# Patient Record
Sex: Male | Born: 1980 | Race: White | Hispanic: No | Marital: Married | Smoking: Former smoker
Health system: Southern US, Community
[De-identification: ages and names within clinical notes are randomized; demographics above are authoritative.]

## PROBLEM LIST (undated history)

## (undated) DIAGNOSIS — Z87891 Personal history of nicotine dependence: Secondary | ICD-10-CM

## (undated) DIAGNOSIS — E669 Obesity, unspecified: Secondary | ICD-10-CM

## (undated) DIAGNOSIS — I10 Essential (primary) hypertension: Secondary | ICD-10-CM

## (undated) DIAGNOSIS — R740 Nonspecific elevation of levels of transaminase and lactic acid dehydrogenase [LDH]: Secondary | ICD-10-CM

## (undated) HISTORY — DX: Essential (primary) hypertension: I10

## (undated) HISTORY — DX: Personal history of nicotine dependence: Z87.891

## (undated) HISTORY — DX: Nonspecific elevation of levels of transaminase and lactic acid dehydrogenase (ldh): R74.0

## (undated) HISTORY — DX: Obesity, unspecified: E66.9

## (undated) HISTORY — PX: KNEE SURGERY: SHX244

---

## 2017-12-16 ENCOUNTER — Ambulatory Visit (INDEPENDENT_AMBULATORY_CARE_PROVIDER_SITE_OTHER): Payer: No Typology Code available for payment source | Admitting: Physician Assistant

## 2017-12-16 ENCOUNTER — Encounter (INDEPENDENT_AMBULATORY_CARE_PROVIDER_SITE_OTHER): Payer: Self-pay

## 2017-12-16 ENCOUNTER — Encounter: Payer: Self-pay | Admitting: Physician Assistant

## 2017-12-16 VITALS — BP 142/92 | HR 82 | Ht 73.0 in | Wt 258.0 lb

## 2017-12-16 DIAGNOSIS — Z87891 Personal history of nicotine dependence: Secondary | ICD-10-CM | POA: Diagnosis not present

## 2017-12-16 DIAGNOSIS — R202 Paresthesia of skin: Secondary | ICD-10-CM | POA: Diagnosis not present

## 2017-12-16 DIAGNOSIS — Z0189 Encounter for other specified special examinations: Secondary | ICD-10-CM

## 2017-12-16 DIAGNOSIS — E6609 Other obesity due to excess calories: Secondary | ICD-10-CM | POA: Diagnosis not present

## 2017-12-16 DIAGNOSIS — Z131 Encounter for screening for diabetes mellitus: Secondary | ICD-10-CM | POA: Diagnosis not present

## 2017-12-16 DIAGNOSIS — Z7689 Persons encountering health services in other specified circumstances: Secondary | ICD-10-CM

## 2017-12-16 DIAGNOSIS — Z130 Encounter for screening for diseases of the blood and blood-forming organs and certain disorders involving the immune mechanism: Secondary | ICD-10-CM

## 2017-12-16 DIAGNOSIS — Z13220 Encounter for screening for lipoid disorders: Secondary | ICD-10-CM | POA: Diagnosis not present

## 2017-12-16 DIAGNOSIS — Z832 Family history of diseases of the blood and blood-forming organs and certain disorders involving the immune mechanism: Secondary | ICD-10-CM

## 2017-12-16 DIAGNOSIS — I10 Essential (primary) hypertension: Secondary | ICD-10-CM | POA: Diagnosis not present

## 2017-12-16 DIAGNOSIS — Z008 Encounter for other general examination: Secondary | ICD-10-CM

## 2017-12-16 NOTE — Progress Notes (Signed)
HPI:                                                                William BottcherBrandon Foley is a 37 y.o. male [facility] today to establish care  Current concerns: high blood pressure  HTN: has been told at urgent care visits for the last year that his BP is high. Does not monitor BP's at home. Family hx of HTN in his father. Denies vision change, headache, chest pain with exertion, orthopnea, lightheadedness, syncope and edema. Risk factors include: obesity, male sex    [facility]/16/2017  Decreased Interest 0  Down, Depressed, Hopeless 0  PHQ - 2 Score 0    No flowsheet data found.    Past Medical History:  Diagnosis Date  . Former smoker   . Hypertension    Past Surgical History:  Procedure Laterality Date  . KNEE SURGERY Right    Social History   Tobacco Use  . Smoking status: Former Smoker    Last attempt to quit: 12/16/2016    Years since quitting: 1.0  . Smokeless tobacco: Never Used  Substance Use Topics  . Alcohol use: Yes    Alcohol/week: 1.8 oz    Types: 3 Standard drinks or equivalent per week   family history includes Bone cancer in his paternal grandmother; Hemophilia in his brother; Hyperlipidemia in his father; Hypertension in his father; Lung cancer in his maternal grandfather; Von Willebrand disease in his maternal aunt and mother.    ROS: Review of Systems  HENT: Positive for congestion and nosebleeds.   Neurological: Positive for tingling (bilateral fingertips/toes).  Endo/Heme/Allergies: Positive for environmental allergies.  All other systems reviewed and are negative.    Medications: No current outpatient medications on file.   No current facility-administered medications for this visit.    No Known Allergies     Objective:  BP (!) 142/92   Pulse 82   Ht 6\' 1"  (1.854 m)   Wt 258 lb (117 kg)   BMI 34.04 kg/m  Gen:  alert, not ill-appearing, no  distress, appropriate for age, obese male HEENT: head normocephalic without obvious abnormality, conjunctiva and cornea clear, trachea midline Pulm: Normal work of breathing, normal phonation, clear to auscultation bilaterally, no wheezes, rales or rhonchi CV: Normal rate, regular rhythm, s1 and s2 distinct, no murmurs, clicks or rubs  Neuro: alert and oriented x 3, no tremor MSK: extremities atraumatic, normal gait and station, no peripheral edema Skin: intact, no rashes on exposed skin, no jaundice, no cyanosis Psych: well-groomed, cooperative, good eye contact, euthymic mood, affect mood-congruent, speech is articulate, and thought processes clear and goal-directed    No results found for this or any previous visit (from the past 72 hour(s)). No results found.    Assessment and Plan: 37 y.o. male with   1. Encounter to establish care - reviewed PMH, PSH, PFH, medications and allergies - reviewed health maintenance - declines influenza and Tdap - negative PHQ2  2. Hypertension goal BP (blood pressure) < 130/80 BP Readings from Last 3 Encounters:  12/16/17 (!) 142/92  - CVD risk factors include obesity and male sex - counseled on therapeutic lifestyle changes - patient to monitor BP at  home and log readings - 4 week follow-up   3. Paresthesias - chronic, intermittent symmetric distal upper and lower extremity paresthesias since childhood. Will start with labs and discuss nerve conduction study - Vitamin B12 - TSH + free T4  4. Class 1 obesity due to excess calories in adult, unspecified BMI, unspecified whether serious comorbidity present Wt Readings from Last 3 Encounters:  12/16/17 258 lb (117 kg)  - counseled on weight loss through decreased caloric intake and increased aerobic exercise   5. Former smoker   6. Encounter for biometric screening - Nicotine and Cotinine LC/MS/MS, U - CBC - Comprehensive metabolic panel - Lipid Panel w/reflex Direct LDL  7.  Screening for lipid disorders - Lipid Panel w/reflex Direct LDL  8. Screening for diabetes mellitus - Comprehensive metabolic panel  9. Screening for blood disease - CBC  Patient education and anticipatory guidance given Patient agrees with treatment plan Follow-up in 1 month for HTN or sooner as needed if symptoms worsen or fail to improve  [name] PA-C

## 2017-12-16 NOTE — Patient Instructions (Addendum)
Fasting labs: [facility] is located on the 1st floor and is a walk-in open M-F 7:30a-4:30p (closed 12:30-1:30p).  Nothing to eat or drink after midnight or at least 8 hours before your blood draw. You can have water and your medications.    For your blood pressure: - Goal <130/80 - monitor and log blood pressures at home - check around the same time each day in a relaxed setting - Limit salt to <2000 mg/day - Follow DASH eating plan - limit alcohol to 2 standard drinks per day for men and 1 per day for women - avoid tobacco products - weight loss: 7% of current body weight - follow-up every 6 months for your blood pressure    Physical Activity Recommendations for modifying lipids and lowering blood pressure Engage in aerobic physical activity to reduce LDL-cholesterol, non-HDL-cholesterol, and blood pressure  Frequency: 3-4 sessions per week  Intensity: moderate to vigorous  Duration: 40 minutes on average  Physical Activity Recommendations for secondary prevention 1. Aerobic exercise  Frequency: 3-5 sessions per week  Intensity: 50-80% capacity  Duration: 20 - 60 minutes  Examples: walking, treadmill, cycling, rowing, stair climbing, and arm/leg ergometry  2. Resistance exercise  Frequency: 2-3 sessions per week  Intensity: 10-15 repetitions/set to moderate fatigue  Duration: 1-3 sets of 8-10 upper and lower body exercises  Examples: calisthenics, elastic bands, cuff/hand weights, dumbbels, free weights, wall pulleys, and weight machines  Heart-Healthy Lifestyle  Eating a diet rich in vegetables, fruits and whole grains: also includes low-fat dairy products, poultry, fish, legumes, and nuts; limit intake of sweets, sugar-sweetened beverages and red meats  Getting regular exercise  Maintaining a healthy weight  Not smoking or getting help quitting  Staying on top of your health; for some people, lifestyle changes alone may not be enough to prevent a  heart attack or stroke. In these cases, taking a statin at the right dose will most likely be necessary

## 2017-12-21 LAB — CBC
HEMATOCRIT: 45.1 % (ref 38.5–50.0)
HEMOGLOBIN: 15.8 g/dL (ref 13.2–17.1)
MCH: 29.8 pg (ref 27.0–33.0)
MCHC: 35 g/dL (ref 32.0–36.0)
MCV: 85.1 fL (ref 80.0–100.0)
MPV: 10.2 fL (ref 7.5–12.5)
Platelets: 295 10*3/uL (ref 140–400)
RBC: 5.3 10*6/uL (ref 4.20–5.80)
RDW: 13 % (ref 11.0–15.0)
WBC: 8.3 10*3/uL (ref 3.8–10.8)

## 2017-12-21 LAB — LIPID PANEL W/REFLEX DIRECT LDL
Cholesterol: 198 mg/dL (ref <200)
HDL: 42 mg/dL (ref >40)
LDL CHOLESTEROL (CALC): 115 mg/dL — AB
NON-HDL CHOLESTEROL (CALC): 156 mg/dL — AB (ref <130)
Total CHOL/HDL Ratio: 4.7 (calc) (ref <5.0)
Triglycerides: 287 mg/dL — ABNORMAL HIGH (ref <150)

## 2017-12-21 LAB — COMPREHENSIVE METABOLIC PANEL
AG RATIO: 1.6 (calc) (ref 1.0–2.5)
ALBUMIN MSPROF: 4.6 g/dL (ref 3.6–5.1)
ALT: 31 U/L (ref 9–46)
AST: 22 U/L (ref 10–40)
Alkaline phosphatase (APISO): 50 U/L (ref 40–115)
BILIRUBIN TOTAL: 0.5 mg/dL (ref 0.2–1.2)
BUN: 16 mg/dL (ref 7–25)
CO2: 27 mmol/L (ref 20–32)
Calcium: 9.6 mg/dL (ref 8.6–10.3)
Chloride: 101 mmol/L (ref 98–110)
Creat: 1.23 mg/dL (ref 0.60–1.35)
Globulin: 2.8 g/dL (calc) (ref 1.9–3.7)
Glucose, Bld: 102 mg/dL — ABNORMAL HIGH (ref 65–99)
POTASSIUM: 4.5 mmol/L (ref 3.5–5.3)
SODIUM: 139 mmol/L (ref 135–146)
TOTAL PROTEIN: 7.4 g/dL (ref 6.1–8.1)

## 2017-12-21 LAB — TSH+FREE T4: TSH W/REFLEX TO FT4: 3.32 m[IU]/L (ref 0.40–4.50)

## 2017-12-21 LAB — NICOTINE AND COTININE LC/MS/MS, U: COTININE, URINE: <2 ng/mL

## 2017-12-21 LAB — VITAMIN B12: VITAMIN B 12: 729 pg/mL (ref 200–1100)

## 2017-12-22 ENCOUNTER — Encounter: Payer: Self-pay | Admitting: Physician Assistant

## 2017-12-22 DIAGNOSIS — R7301 Impaired fasting glucose: Secondary | ICD-10-CM | POA: Insufficient documentation

## 2017-12-22 DIAGNOSIS — E781 Pure hyperglyceridemia: Secondary | ICD-10-CM | POA: Insufficient documentation

## 2017-12-22 NOTE — Progress Notes (Signed)
Good morning William Foley,  Your labs look good overall - your triglycerides were a little high, but your total and LDL cholesterol is normal. Work on reducing saturated fats and eating lean proteins and foods rich in omega-3's. - fasting blood sugar is 102, which is a little high but not in a diabetic range. Recommend we recheck this in 6 months.  Your form and results will be faxed to your employer and you can pick-up a hard-copy from the front desk by end of day today.  Best, Vinetta Bergamoharley

## 2018-01-13 ENCOUNTER — Ambulatory Visit (INDEPENDENT_AMBULATORY_CARE_PROVIDER_SITE_OTHER): Payer: No Typology Code available for payment source | Admitting: Physician Assistant

## 2018-01-13 ENCOUNTER — Encounter: Payer: Self-pay | Admitting: Physician Assistant

## 2018-01-13 VITALS — BP 129/87 | HR 89 | Wt 250.0 lb

## 2018-01-13 DIAGNOSIS — I10 Essential (primary) hypertension: Secondary | ICD-10-CM | POA: Diagnosis not present

## 2018-01-13 DIAGNOSIS — E781 Pure hyperglyceridemia: Secondary | ICD-10-CM | POA: Diagnosis not present

## 2018-01-13 DIAGNOSIS — E6609 Other obesity due to excess calories: Secondary | ICD-10-CM | POA: Diagnosis not present

## 2018-01-13 DIAGNOSIS — R7301 Impaired fasting glucose: Secondary | ICD-10-CM

## 2018-01-13 NOTE — Progress Notes (Signed)
HPI:                                                                William BottcherBrandon Foley is a 37 y.o. male who [facility] today for hypertension follow-up  HTN: monitoring BP's at home. BP range 108-138/73-90, average diastolic above 80. Denies vision change, headache, chest pain with exertion, orthopnea, lightheadedness, syncope and edema. Risk factors include: male sex, obesity     [name] 2[facility] 12/16/2017  Decreased Interest 0  Down, Depressed, Hopeless 0  PHQ - 2 Score 0    No flowsheet data found.    Past Medical History:  Diagnosis Date  . Former smoker   . Hypertension    Past Surgical History:  Procedure Laterality Date  . KNEE SURGERY Right    Social History   Tobacco Use  . Smoking status: Former Smoker    Last attempt to quit: 12/16/2016    Years since quitting: 1.0  . Smokeless tobacco: Never Used  Substance Use Topics  . Alcohol use: Yes    Alcohol/week: 1.8 oz    Types: 3 Standard drinks or equivalent per week   family history includes Bone cancer in his paternal grandmother; Hemophilia in his brother; Hyperlipidemia in his father; Hypertension in his father; Lung cancer in his maternal grandfather; Von Willebrand disease in his maternal aunt and mother.    ROS: negative except as noted in the HPI  Medications: No current outpatient medications on file.   No current facility-administered medications for this visit.    No Known Allergies     Objective:  BP 129/87   Pulse 89   Wt 250 lb (113.4 kg)   BMI 32.98 kg/m  Gen:  alert, not ill-appearing, no distress, appropriate for age, athletic build HEENT: head normocephalic without obvious abnormality, conjunctiva and cornea clear, trachea midline Pulm: Normal work of breathing, normal phonation, clear to auscultation bilaterally, no wheezes, rales or rhonchi CV: Normal rate, regular rhythm, s1 and s2 distinct, no murmurs,  clicks or rubs  Neuro: alert and oriented x 3, no tremor MSK: extremities atraumatic, normal gait and station Skin: intact, no rashes on exposed skin, no jaundice, no cyanosis Psych: well-groomed, cooperative, good eye contact, euthymic mood, affect mood-congruent, speech is articulate, and thought processes clear and goal-directed    No results found for this or any previous visit (from the past 72 hour(s)). No results found.    Assessment and Plan: 37 y.o. male with   1. Hypertension goal BP (blood pressure) < 130/80 BP Readings from Last 3 Encounters:  01/13/18 129/87  12/16/17 (!) 142/92  - home readings show diastolic hypertension. CVD risk factors - male sex and obesity. Recommend he continue therapeutic lifestyle changes and monitor BP intermittently at home. I think he can manage his BP with diet and exercise. Will consider adding medication if diastolic BP is consistently >90 or he develops new risk factors   2. Fasting hyperglycemia No results found for: GLUF   3. Hypertriglyceridemia Lab Results  Component Value Date   CHOL 198 12/19/2017   HDL 42 12/19/2017   LDLCALC 115 (H) 12/19/2017   TRIG 287 (H) 12/19/2017   CHOLHDL 4.7 12/19/2017  - mildly elevated, therapeutic  lifestyle changes   4. Class 1 obesity due to excess calories with serious comorbidity in adult, unspecified BMI Wt Readings from Last 3 Encounters:  01/13/18 250 lb (113.4 kg)  12/16/17 258 lb (117 kg)  - has lost 8 pounds in 4 weeks! Following DASH eating plan and exercising. Encouraged to keep up regular aerobic exercise and decreased caloric intake   Patient education and anticipatory guidance given Patient agrees with treatment plan Follow-up in 6 months for hypertension or sooner as needed if symptoms worsen or fail to improve  [name] PA-C

## 2018-01-13 NOTE — Patient Instructions (Signed)

## 2018-07-17 ENCOUNTER — Ambulatory Visit: Payer: No Typology Code available for payment source | Admitting: Physician Assistant

## 2018-12-15 ENCOUNTER — Ambulatory Visit (INDEPENDENT_AMBULATORY_CARE_PROVIDER_SITE_OTHER): Payer: No Typology Code available for payment source | Admitting: Physician Assistant

## 2018-12-15 ENCOUNTER — Encounter: Payer: Self-pay | Admitting: Physician Assistant

## 2018-12-15 ENCOUNTER — Other Ambulatory Visit: Payer: Self-pay | Admitting: Physician Assistant

## 2018-12-15 VITALS — BP 132/83 | HR 80 | Wt 263.0 lb

## 2018-12-15 DIAGNOSIS — R7401 Elevation of levels of liver transaminase levels: Secondary | ICD-10-CM

## 2018-12-15 DIAGNOSIS — E6609 Other obesity due to excess calories: Secondary | ICD-10-CM | POA: Diagnosis not present

## 2018-12-15 DIAGNOSIS — E781 Pure hyperglyceridemia: Secondary | ICD-10-CM

## 2018-12-15 DIAGNOSIS — R7301 Impaired fasting glucose: Secondary | ICD-10-CM

## 2018-12-15 DIAGNOSIS — Z0000 Encounter for general adult medical examination without abnormal findings: Secondary | ICD-10-CM

## 2018-12-15 DIAGNOSIS — I10 Essential (primary) hypertension: Secondary | ICD-10-CM | POA: Diagnosis not present

## 2018-12-15 DIAGNOSIS — R740 Nonspecific elevation of levels of transaminase and lactic acid dehydrogenase [LDH]: Secondary | ICD-10-CM

## 2018-12-15 NOTE — Patient Instructions (Addendum)
Send MyChart message with BP readings in 2 weeks Username: [id number]  For your blood pressure: - Goal <130/80 (Ideally 120's/70's) - monitor and log blood pressures at home - check around the same time each day in a relaxed setting - Limit salt to <2500 mg/day - Follow DASH (Dietary Approach to Stopping Hypertension) eating plan - Try to get at least 150 minutes of aerobic exercise per week - Aim to go on a brisk walk 30 minutes per day at least 5 days per week. If you're not active, gradually increase how long you walk by 5 minutes each week - limit alcohol: 2 standard drinks per day for men and 1 per day for women - avoid tobacco/nicotine products. Consider smoking cessation if you smoke - weight loss: 7% of current body weight can reduce your blood pressure by 5-10 points - follow-up at least every 6 months for your blood pressure. Follow-up sooner if your BP is not controlled   Preventive Care 18-39 Years, Male Preventive care refers to lifestyle choices and visits with your health care provider that can promote health and wellness. What does preventive care include?   A yearly physical exam. This is also called an annual well check.  Dental exams once or twice a year.  Routine eye exams. Ask your health care provider how often you should have your eyes checked.  Personal lifestyle choices, including: ? Daily care of your teeth and gums. ? Regular physical activity. ? Eating a healthy diet. ? Avoiding tobacco and drug use. ? Limiting alcohol use. ? Practicing safe sex. What happens during an annual well check? The services and screenings done by your health care provider during your annual well check will depend on your age, overall health, lifestyle risk factors, and family history of disease. Counseling Your health care provider may ask you questions about your:  Alcohol use.  Tobacco use.  Drug use.  Emotional well-being.  Home and relationship  well-being.  Sexual activity.  Eating habits.  Work and work Statistician. Screening You may have the following tests or measurements:  Height, weight, and BMI.  Blood pressure.  Lipid and cholesterol levels. These may be checked every 5 years starting at age 62.  Diabetes screening. This is done by checking your blood sugar (glucose) after you have not eaten for a while (fasting).  Skin check.  Hepatitis C blood test.  Hepatitis B blood test.  Sexually transmitted disease (STD) testing. Discuss your test results, treatment options, and if necessary, the need for more tests with your health care provider. Vaccines Your health care provider may recommend certain vaccines, such as:  Influenza vaccine. This is recommended every year.  Tetanus, diphtheria, and acellular pertussis (Tdap, Td) vaccine. You may need a Td booster every 10 years.  Varicella vaccine. You may need this if you have not been vaccinated.  HPV vaccine. If you are 36 or younger, you may need three doses over 6 months.  Measles, mumps, and rubella (MMR) vaccine. You may need at least one dose of MMR.You may also need a second dose.  Pneumococcal 13-valent conjugate (PCV13) vaccine. You may need this if you have certain conditions and have not been vaccinated.  Pneumococcal polysaccharide (PPSV23) vaccine. You may need one or two doses if you smoke cigarettes or if you have certain conditions.  Meningococcal vaccine. One dose is recommended if you are age 36-21 years and a first-year college student living in a residence hall, or if you have one of several  medical conditions. You may also need additional booster doses.  Hepatitis A vaccine. You may need this if you have certain conditions or if you travel or work in places where you may be exposed to hepatitis A.  Hepatitis B vaccine. You may need this if you have certain conditions or if you travel or work in places where you may be exposed to hepatitis  B.  Haemophilus influenzae type b (Hib) vaccine. You may need this if you have certain risk factors. Talk to your health care provider about which screenings and vaccines you need and how often you need them. This information is not intended to replace advice given to you by your health care provider. Make sure you discuss any questions you have with your health care provider. Document Released: 12/07/2001 Document Revised: 05/24/2017 Document Reviewed: 08/12/2015 Elsevier Interactive Patient Education  2019 [employer].

## 2018-12-15 NOTE — Progress Notes (Signed)
HPI:                                                                William Foley is a 38 y.o. male who presents [name] Primary [facility] today for annual physical exam  Current concerns: none    Depression screen [facility] 2/9 12/15/2018 12/16/2017  Decreased Interest 0 0  Down, Depressed, Hopeless 0 0  PHQ - 2 Score 0 0    No flowsheet data found.    Past Medical History:  Diagnosis Date  . Former smoker   . Hypertension   . Obesity   . Transaminitis 12/18/2018   Past Surgical History:  Procedure Laterality Date  . KNEE SURGERY Right    Social History   Tobacco Use  . Smoking status: Former Smoker    Packs/day: 0.50    Years: 8.00    Pack years: 4.00    Types: Cigarettes    Last attempt to quit: 12/16/2016    Years since quitting: 2.0  . Smokeless tobacco: Former Neurosurgeon    Types: Chew  Substance Use Topics  . Alcohol use: Yes    Alcohol/week: 3.0 standard drinks    Types: 3 Standard drinks or equivalent per week   family history includes Bone cancer in his paternal grandmother; Hemophilia in his brother; Hyperlipidemia in his father; Hypertension in his father; Lung cancer in his maternal grandfather; Von Willebrand disease in his maternal aunt and mother.    ROS: negative except as noted in the HPI  Medications: No current outpatient medications on file.   No current facility-administered medications for this visit.    No Known Allergies     Objective:  BP 132/83   Pulse 80   Wt 263 lb (119.3 kg)   BMI 34.70 kg/m  General Appearance:  Alert, cooperative, no distress, appropriate for age, obese male                            Head:  Normocephalic, without obvious abnormality                             Eyes:  PERRL, EOM's intact, conjunctiva and cornea clear, wearing glasses                             Ears:  TM pearly gray color and semitransparent, external ear canals normal, both ears                             Nose:  Nares symmetrical, mucosa pink                          Throat:  Lips, tongue, and mucosa are moist, pink, and intact; oropharynx clear, uvula midline; good dentition                             Neck:  Supple; symmetrical, trachea midline, no adenopathy; thyroid: no enlargement, symmetric, no tenderness/mass/nodules  Back:  Symmetrical, no curvature, ROM normal                                        Lungs:  Clear to auscultation bilaterally, respirations unlabored                             Heart:  normal rate & regular rhythm, S1 and S2 normal, no murmurs, rubs, or gallops                     Abdomen:  Soft, non-tender, no mass or organomegaly              Genitourinary:  deferred         Musculoskeletal:  Tone and strength strong and symmetrical, all extremities; no joint pain or edema, normal gait and station,                                     Lymphatic:  No adenopathy             Skin/Hair/Nails:  Limited skin examination of trunk, head, neck and upper extremities was performed and areas of sun damage are noted with scattered lentigines, brown flat macules, pigmented lesions with regular borders are noted                   Neurologic:  Alert and oriented x3, no cranial nerve deficits, sensation grossly intact, normal gait and station, no tremor Psych: well-groomed, cooperative, good eye contact, euthymic mood, affect mood-congruent, speech is articulate, and thought processes clear and goal-directed  BP Readings from Last 3 Encounters:  12/15/18 132/83  01/13/18 129/87  12/16/17 (!) 142/92     Results for orders placed or performed in visit on 12/15/18 (from the past 72 hour(s))  Hemoglobin A1c     Status: None   Collection Time: 12/15/18 10:12 AM  Result Value Ref Range   Hgb A1c MFr Bld 5.5 <5.7 % of total Hgb    Comment: For the purpose of screening for the presence of diabetes: . <5.7%       Consistent with the absence of  diabetes 5.7-6.4%    Consistent with increased risk for diabetes             (prediabetes) > or =6.5%  Consistent with diabetes . This assay result is consistent with a decreased risk of diabetes. . Currently, no consensus exists regarding use of hemoglobin A1c for diagnosis of diabetes in children. . According to American Diabetes Association (ADA) guidelines, hemoglobin A1c <7.0% represents optimal control in non-pregnant diabetic patients. Different metrics may apply to specific patient populations.  Standards of Medical Care in Diabetes(ADA). .    Mean Plasma Glucose 111 (calc)   eAG (mmol/L) 6.2 (calc)  Lipid Panel w/reflex Direct LDL     Status: Abnormal   Collection Time: 12/15/18 10:12 AM  Result Value Ref Range   Cholesterol 218 (H) <200 mg/dL   HDL 48 > OR = 40 mg/dL   Triglycerides 254 <982 mg/dL   LDL Cholesterol (Calc) 145 (H) mg/dL (calc)    Comment: Reference range: <100 . Desirable range <100 mg/dL for primary prevention;   <70 mg/dL for patients with CHD or diabetic patients  with > or =  2 CHD risk factors. Marland Kitchen. LDL-C is now calculated using the Martin-Hopkins  calculation, which is a validated novel method providing  better accuracy than the Friedewald equation in the  estimation of LDL-C.  Horald PollenMartin SS et al. Lenox AhrJAMA. 1610;960(452013;310(19): 2061-2068  (http://education.[facility].com/faq/FAQ164)    Total CHOL/HDL Ratio 4.5 <5.0 (calc)   Non-HDL Cholesterol (Calc) 170 (H) <130 mg/dL (calc)    Comment: For patients with diabetes plus 1 major ASCVD risk  factor, treating to a non-HDL-C goal of <100 mg/dL  (LDL-C of <40<70 mg/dL) is considered a therapeutic  option.   COMPLETE METABOLIC PANEL WITH GFR     Status: Abnormal   Collection Time: 12/15/18 10:12 AM  Result Value Ref Range   Glucose, Bld 97 65 - 99 mg/dL    Comment: .            Fasting reference interval .    BUN 15 7 - 25 mg/dL   Creat 9.811.14 1.910.60 - 4.781.35 mg/dL   GFR, Est Non African American 83 > OR =  60 mL/min/1.5373m2   GFR, Est African American 96 > OR = 60 mL/min/1.5473m2   BUN/Creatinine Ratio NOT APPLICABLE 6 - 22 (calc)   Sodium 139 135 - 146 mmol/L   Potassium 4.4 3.5 - 5.3 mmol/L   Chloride 102 98 - 110 mmol/L   CO2 29 20 - 32 mmol/L   Calcium 9.6 8.6 - 10.3 mg/dL   Total Protein 7.2 6.1 - 8.1 g/dL   Albumin 4.6 3.6 - 5.1 g/dL   Globulin 2.6 1.9 - 3.7 g/dL (calc)   AG Ratio 1.8 1.0 - 2.5 (calc)   Total Bilirubin 0.5 0.2 - 1.2 mg/dL   Alkaline phosphatase (APISO) 44 36 - 130 U/L   AST 56 (H) 10 - 40 U/L   ALT 73 (H) 9 - 46 U/L  CBC     Status: None   Collection Time: 12/15/18 10:12 AM  Result Value Ref Range   WBC 6.4 3.8 - 10.8 Thousand/uL   RBC 4.80 4.20 - 5.80 Million/uL   Hemoglobin 14.6 13.2 - 17.1 g/dL   HCT 29.541.1 62.138.5 - 30.850.0 %   MCV 85.6 80.0 - 100.0 fL   MCH 30.4 27.0 - 33.0 pg   MCHC 35.5 32.0 - 36.0 g/dL   RDW 65.712.6 84.611.0 - 96.215.0 %   Platelets 283 140 - 400 Thousand/uL   MPV 10.2 7.5 - 12.5 fL   No results found.    Assessment and Plan: 38 y.o. male with   .Apolinar JunesBrandon was seen today for annual exam.  Diagnoses and all orders for this visit:  Encounter for annual physical exam  Class 1 obesity due to excess calories in adult, unspecified BMI, unspecified whether serious comorbidity present  Hypertension goal BP (blood pressure) < 130/80  Hypertriglyceridemia  Fasting hyperglycemia   - Personally reviewed PMH, PSH, PFH, medications, allergies, HM - Age-appropriate cancer screening: n/a - Influenza declined - Tdap declined - PHQ2 negative - Routine fasting labs pending - BMI 34 - patient counseled on weight loss through decreased caloric intake and increased aerobic exercise - BP in stage 1 hypertensive range - patient to monitor and log BP's at home and send readings via MyChart in 2 weeks. Counseled on therapeutic lifestyle changes     Patient education and anticipatory guidance given Patient agrees with treatment plan Follow-up based on lab  results and home BP readings or sooner as needed if symptoms worsen or fail to improve  Levonne Hubertharley E. Ladawn Boullion  PA-C

## 2018-12-16 LAB — COMPLETE METABOLIC PANEL WITH GFR
AG Ratio: 1.8 (calc) (ref 1.0–2.5)
ALT: 73 U/L — ABNORMAL HIGH (ref 9–46)
AST: 56 U/L — ABNORMAL HIGH (ref 10–40)
Albumin: 4.6 g/dL (ref 3.6–5.1)
Alkaline phosphatase (APISO): 44 U/L (ref 36–130)
BUN: 15 mg/dL (ref 7–25)
CO2: 29 mmol/L (ref 20–32)
Calcium: 9.6 mg/dL (ref 8.6–10.3)
Chloride: 102 mmol/L (ref 98–110)
Creat: 1.14 mg/dL (ref 0.60–1.35)
GFR, Est African American: 96 mL/min/{1.73_m2} (ref >=60)
GFR, Est Non African American: 83 mL/min/{1.73_m2} (ref >=60)
Globulin: 2.6 g/dL (calc) (ref 1.9–3.7)
Glucose, Bld: 97 mg/dL (ref 65–99)
POTASSIUM: 4.4 mmol/L (ref 3.5–5.3)
Sodium: 139 mmol/L (ref 135–146)
Total Bilirubin: 0.5 mg/dL (ref 0.2–1.2)
Total Protein: 7.2 g/dL (ref 6.1–8.1)

## 2018-12-16 LAB — CBC
HCT: 41.1 % (ref 38.5–50.0)
Hemoglobin: 14.6 g/dL (ref 13.2–17.1)
MCH: 30.4 pg (ref 27.0–33.0)
MCHC: 35.5 g/dL (ref 32.0–36.0)
MCV: 85.6 fL (ref 80.0–100.0)
MPV: 10.2 fL (ref 7.5–12.5)
PLATELETS: 283 10*3/uL (ref 140–400)
RBC: 4.8 10*6/uL (ref 4.20–5.80)
RDW: 12.6 % (ref 11.0–15.0)
WBC: 6.4 10*3/uL (ref 3.8–10.8)

## 2018-12-16 LAB — LIPID PANEL W/REFLEX DIRECT LDL
Cholesterol: 218 mg/dL — ABNORMAL HIGH (ref <200)
HDL: 48 mg/dL (ref >=40)
LDL Cholesterol (Calc): 145 mg/dL (calc) — ABNORMAL HIGH
Non-HDL Cholesterol (Calc): 170 mg/dL (calc) — ABNORMAL HIGH (ref <130)
Total CHOL/HDL Ratio: 4.5 (calc) (ref <5.0)
Triglycerides: 129 mg/dL (ref <150)

## 2018-12-16 LAB — HEMOGLOBIN A1C
EAG (MMOL/L): 6.2 (calc)
Hgb A1c MFr Bld: 5.5 % of total Hgb (ref <5.7)
Mean Plasma Glucose: 111 (calc)

## 2018-12-18 ENCOUNTER — Encounter: Payer: Self-pay | Admitting: Physician Assistant

## 2018-12-18 ENCOUNTER — Other Ambulatory Visit: Payer: Self-pay | Admitting: Physician Assistant

## 2018-12-18 DIAGNOSIS — R740 Nonspecific elevation of levels of transaminase and lactic acid dehydrogenase [LDH]: Secondary | ICD-10-CM

## 2018-12-18 DIAGNOSIS — R7401 Elevation of levels of liver transaminase levels: Secondary | ICD-10-CM | POA: Insufficient documentation

## 2018-12-18 DIAGNOSIS — Z0189 Encounter for other specified special examinations: Secondary | ICD-10-CM

## 2018-12-18 HISTORY — DX: Elevation of levels of liver transaminase levels: R74.01

## 2018-12-18 NOTE — Addendum Note (Signed)
Addended by: Gena Fray E on: 12/18/2018 08:09 AM   Modules accepted: Orders

## 2019-02-05 DIAGNOSIS — Z3141 Encounter for fertility testing: Secondary | ICD-10-CM | POA: Diagnosis not present

## 2020-03-14 ENCOUNTER — Other Ambulatory Visit: Payer: Self-pay

## 2020-03-14 ENCOUNTER — Ambulatory Visit (INDEPENDENT_AMBULATORY_CARE_PROVIDER_SITE_OTHER): Payer: BC Managed Care – PPO

## 2020-03-14 ENCOUNTER — Ambulatory Visit (INDEPENDENT_AMBULATORY_CARE_PROVIDER_SITE_OTHER): Payer: BC Managed Care – PPO | Admitting: Sports Medicine

## 2020-03-14 ENCOUNTER — Encounter: Payer: Self-pay | Admitting: Sports Medicine

## 2020-03-14 DIAGNOSIS — M5136 Other intervertebral disc degeneration, lumbar region: Secondary | ICD-10-CM

## 2020-03-14 DIAGNOSIS — M51369 Other intervertebral disc degeneration, lumbar region without mention of lumbar back pain or lower extremity pain: Secondary | ICD-10-CM | POA: Insufficient documentation

## 2020-03-14 DIAGNOSIS — M545 Low back pain: Secondary | ICD-10-CM | POA: Diagnosis not present

## 2020-03-14 MED ORDER — PREDNISONE 50 MG PO TABS: ORAL_TABLET | ORAL | 0 refills | Status: DC

## 2020-03-14 NOTE — Progress Notes (Addendum)
    Procedures performed today:    None.  Independent interpretation of notes and tests performed by another provider:   None.  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 For some time now has had axial back pain, left-sided with radiation down the back of the left leg, but not to the knee. No red flag symptoms. We will start conservatively with prednisone, ibuprofen over-the-counter afterwards, home rehab exercises, he cannot do physical therapy due to his work. Getting some x-rays today, return to see me in 6 weeks, MRI for interventional planning if no better, we did discuss evolution anthropology of back pain and disc disease.    ___________________________________________ William Foley. Benjamin Stain, M.D., ABFM., CAQSM. Primary Care and Sports Medicine [place] [facility]  Adjunct Instructor of Family Medicine  [employer]

## 2020-03-14 NOTE — Assessment & Plan Note (Addendum)
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 For some time now has had axial back pain, left-sided with radiation down the back of the left leg, but not to the knee. No red flag symptoms. We will start conservatively with prednisone, ibuprofen over-the-counter afterwards, home rehab exercises, he cannot do physical therapy due to his work. Getting some x-rays today, return to see me in 6 weeks, MRI for interventional planning if no better, we did discuss evolution anthropology of back pain and disc disease.

## 2020-04-25 ENCOUNTER — Ambulatory Visit: Payer: BC Managed Care – PPO | Admitting: Sports Medicine

## 2020-08-25 ENCOUNTER — Ambulatory Visit (INDEPENDENT_AMBULATORY_CARE_PROVIDER_SITE_OTHER): Payer: BC Managed Care – PPO

## 2020-08-25 ENCOUNTER — Other Ambulatory Visit: Payer: Self-pay

## 2020-08-25 ENCOUNTER — Ambulatory Visit (INDEPENDENT_AMBULATORY_CARE_PROVIDER_SITE_OTHER): Payer: BC Managed Care – PPO | Admitting: Sports Medicine

## 2020-08-25 DIAGNOSIS — M5126 Other intervertebral disc displacement, lumbar region: Secondary | ICD-10-CM

## 2020-08-25 DIAGNOSIS — M5136 Other intervertebral disc degeneration, lumbar region: Secondary | ICD-10-CM | POA: Diagnosis not present

## 2020-08-25 DIAGNOSIS — M4317 Spondylolisthesis, lumbosacral region: Secondary | ICD-10-CM | POA: Diagnosis not present

## 2020-08-25 DIAGNOSIS — M51369 Other intervertebral disc degeneration, lumbar region without mention of lumbar back pain or lower extremity pain: Secondary | ICD-10-CM

## 2020-08-25 DIAGNOSIS — M5117 Intervertebral disc disorders with radiculopathy, lumbosacral region: Secondary | ICD-10-CM | POA: Diagnosis not present

## 2020-08-25 DIAGNOSIS — M5116 Intervertebral disc disorders with radiculopathy, lumbar region: Secondary | ICD-10-CM | POA: Diagnosis not present

## 2020-08-25 MED ORDER — IBUPROFEN 800 MG PO TABS: 800.0000 mg | ORAL_TABLET | Freq: Three times a day (TID) | ORAL | 2 refills | Status: DC | PRN

## 2020-08-25 NOTE — Addendum Note (Signed)
Addended by: Monica Becton on: 08/25/2020 03:52 PM   Modules accepted: Orders

## 2020-08-25 NOTE — Assessment & Plan Note (Addendum)
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 We saw him back in May, he was having left-sided lumbar radiculitis, axial discogenic pain with left L5 distribution radiculitis. I did personally review his lumbar spine x-rays, he does have significant L5-S1 DDD. We did prednisone, home rehab, unfortunately in spite of greater than 6 weeks of physician directed conservative measures he has not improved, proceeding with MRI for plans for left L5 epidural with Dr. Laurian Brim. He is underdosing himself with ibuprofen so we are going to call in a prescription strength dose.  MRI shows left S1 nerve root compression, referral to Dr. Laurian Brim for S1 epidural.

## 2020-08-25 NOTE — Progress Notes (Addendum)
    Procedures performed today:    None.  Independent interpretation of notes and tests performed by another provider:   I did personally review his lumbar spine x-rays, he does have significant L5-S1 DDD.  Lumbar spine MRI personally reviewed, there is a definite large L5-S1 disc protrusion that does press the intraspinal left S1 nerve root.  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 We saw him back in May, he was having left-sided lumbar radiculitis, axial discogenic pain with left L5 distribution radiculitis. I did personally review his lumbar spine x-rays, he does have significant L5-S1 DDD. We did prednisone, home rehab, unfortunately in spite of greater than 6 weeks of physician directed conservative measures he has not improved, proceeding with MRI for plans for left L5 epidural with Dr. Laurian Brim. He is underdosing himself with ibuprofen so we are going to call in a prescription strength dose.  MRI shows left S1 nerve root compression, referral to Dr. Laurian Brim for S1 epidural.    ___________________________________________ [name]. William Foley, M.D., ABFM., CAQSM. Primary Care and Sports Medicine [place] [facility]  Adjunct Instructor of Family Medicine  [employer]

## 2020-08-27 ENCOUNTER — Encounter: Payer: Self-pay | Admitting: Sports Medicine

## 2020-09-16 DIAGNOSIS — M5136 Other intervertebral disc degeneration, lumbar region: Secondary | ICD-10-CM | POA: Diagnosis not present

## 2020-09-16 DIAGNOSIS — M4726 Other spondylosis with radiculopathy, lumbar region: Secondary | ICD-10-CM | POA: Diagnosis not present

## 2020-10-27 DIAGNOSIS — U071 COVID-19: Secondary | ICD-10-CM | POA: Diagnosis not present

## 2020-10-27 DIAGNOSIS — Z20822 Contact with and (suspected) exposure to covid-19: Secondary | ICD-10-CM | POA: Diagnosis not present

## 2020-10-29 DIAGNOSIS — U071 COVID-19: Secondary | ICD-10-CM | POA: Diagnosis not present

## 2020-10-29 DIAGNOSIS — Z20822 Contact with and (suspected) exposure to covid-19: Secondary | ICD-10-CM | POA: Diagnosis not present

## 2020-11-23 ENCOUNTER — Other Ambulatory Visit: Payer: Self-pay | Admitting: Sports Medicine

## 2020-11-23 DIAGNOSIS — M5136 Other intervertebral disc degeneration, lumbar region: Secondary | ICD-10-CM

## 2021-01-09 ENCOUNTER — Ambulatory Visit: Payer: BC Managed Care – PPO | Admitting: Sports Medicine

## 2021-02-09 ENCOUNTER — Other Ambulatory Visit: Payer: Self-pay

## 2021-02-09 ENCOUNTER — Ambulatory Visit (INDEPENDENT_AMBULATORY_CARE_PROVIDER_SITE_OTHER): Payer: BC Managed Care – PPO

## 2021-02-09 ENCOUNTER — Ambulatory Visit: Payer: BC Managed Care – PPO | Admitting: Sports Medicine

## 2021-02-09 DIAGNOSIS — M25512 Pain in left shoulder: Secondary | ICD-10-CM | POA: Diagnosis not present

## 2021-02-09 DIAGNOSIS — M19011 Primary osteoarthritis, right shoulder: Secondary | ICD-10-CM | POA: Diagnosis not present

## 2021-02-09 DIAGNOSIS — G8929 Other chronic pain: Secondary | ICD-10-CM

## 2021-02-09 DIAGNOSIS — M5136 Other intervertebral disc degeneration, lumbar region: Secondary | ICD-10-CM

## 2021-02-09 DIAGNOSIS — M25511 Pain in right shoulder: Secondary | ICD-10-CM

## 2021-02-09 DIAGNOSIS — M19012 Primary osteoarthritis, left shoulder: Secondary | ICD-10-CM | POA: Diagnosis not present

## 2021-02-09 DIAGNOSIS — M51369 Other intervertebral disc degeneration, lumbar region without mention of lumbar back pain or lower extremity pain: Secondary | ICD-10-CM

## 2021-02-09 MED ORDER — MELOXICAM 15 MG PO TABS: ORAL_TABLET | ORAL | 3 refills | Status: DC

## 2021-02-09 NOTE — Assessment & Plan Note (Signed)
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 On exam he has a minimally positive crossarm sign, a positive bilateral Speed sign right worse than left. Impingement signs and symptoms are minimal. 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 Adding meloxicam, bilateral shoulder x-rays, cuff rehab exercises with black Thera-Band. I do suspect he has some biceps tendinitis and distal clavicular osteolysis. Return to see me in 6 weeks.

## 2021-02-09 NOTE — Progress Notes (Signed)
    Procedures performed today:    None.  Independent interpretation of notes and tests performed by another provider:   None.  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 On exam he has a minimally positive crossarm sign, a positive bilateral Speed sign right worse than left. Impingement signs and symptoms are minimal. 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 Adding meloxicam, bilateral shoulder x-rays, cuff rehab exercises with black Thera-Band. I do suspect he has some biceps tendinitis and distal clavicular osteolysis. Return to see me in 6 weeks.  Lumbar degenerative disc disease Clemons had a transforaminal epidural with Dr. Laurian Brim last year and did well, no further back pain or radicular pain. Return as needed for this.    ___________________________________________ [name]. Benjamin Stain, M.D., ABFM., CAQSM. Primary Care and Sports Medicine [place] [facility]  Adjunct Instructor of Family Medicine  [employer]

## 2021-02-09 NOTE — Assessment & Plan Note (Signed)
Samad had a transforaminal epidural with Dr. Laurian Brim last year and did well, no further back pain or radicular pain. Return as needed for this.

## 2021-03-20 ENCOUNTER — Ambulatory Visit: Payer: BC Managed Care – PPO | Admitting: Sports Medicine

## 2021-08-25 ENCOUNTER — Ambulatory Visit (INDEPENDENT_AMBULATORY_CARE_PROVIDER_SITE_OTHER): Payer: BC Managed Care – PPO | Admitting: Sports Medicine

## 2021-08-25 ENCOUNTER — Ambulatory Visit (INDEPENDENT_AMBULATORY_CARE_PROVIDER_SITE_OTHER): Payer: BC Managed Care – PPO

## 2021-08-25 ENCOUNTER — Other Ambulatory Visit: Payer: Self-pay

## 2021-08-25 DIAGNOSIS — M542 Cervicalgia: Secondary | ICD-10-CM

## 2021-08-25 DIAGNOSIS — M5030 Other cervical disc degeneration, unspecified cervical region: Secondary | ICD-10-CM | POA: Insufficient documentation

## 2021-08-25 MED ORDER — PREDNISONE 50 MG PO TABS: ORAL_TABLET | ORAL | 0 refills | Status: DC

## 2021-08-25 NOTE — Progress Notes (Signed)
    Procedures performed today:    None.  Independent interpretation of notes and tests performed by another provider:   None.  Brief History, Exam, Impression, and Recommendations:    Neck pain This is a pleasant 40 year old male, for a month now he said increasing pain cervicothoracic junction directly over the C7 spinous process, some pain radiating around the shoulders and in the periscapular region but nothing overtly radicular to the hands or fingertips. No injuries, no activities out of the ordinary, does spend a lot of time in the gym. No progressive weakness. No constitutional symptoms. Adding x-rays, 5 days of prednisone, avoid upper body lifting for the next 2 weeks followed by open chain activities with machines after that. Return to see me in 4 weeks, MRI if no better.    ___________________________________________ [name]. Benjamin Stain, M.D., ABFM., CAQSM. Primary Care and Sports Medicine [place] [facility]  Adjunct Instructor of Family Medicine  [employer]

## 2021-08-25 NOTE — Assessment & Plan Note (Signed)
This is a pleasant 40 year old male, for a month now he said increasing pain cervicothoracic junction directly over the C7 spinous process, some pain radiating around the shoulders and in the periscapular region but nothing overtly radicular to the hands or fingertips. No injuries, no activities out of the ordinary, does spend a lot of time in the gym. No progressive weakness. No constitutional symptoms. Adding x-rays, 5 days of prednisone, avoid upper body lifting for the next 2 weeks followed by open chain activities with machines after that. Return to see me in 4 weeks, MRI if no better.

## 2021-09-25 ENCOUNTER — Ambulatory Visit (INDEPENDENT_AMBULATORY_CARE_PROVIDER_SITE_OTHER): Payer: BC Managed Care – PPO | Admitting: Sports Medicine

## 2021-09-25 ENCOUNTER — Other Ambulatory Visit: Payer: Self-pay

## 2021-09-25 DIAGNOSIS — M5030 Other cervical disc degeneration, unspecified cervical region: Secondary | ICD-10-CM

## 2021-09-25 MED ORDER — MELOXICAM 15 MG PO TABS
ORAL_TABLET | ORAL | 3 refills | Status: DC
Start: 2021-09-25 — End: 2022-02-02

## 2021-09-25 NOTE — Progress Notes (Signed)
    Procedures performed today:    None.  Independent interpretation of notes and tests performed by another provider:   None.  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    ___________________________________________ William Foley. William Foley, M.D., ABFM., CAQSM. Primary Care and Sports Medicine [place] [facility]  Adjunct Instructor of Family Medicine  [employer]

## 2021-09-25 NOTE — Assessment & Plan Note (Signed)
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

## 2021-10-20 IMAGING — DX DG SHOULDER 2+V*L*
3 series · 3 of 3 positions shown · non-contrast
Comparison: None.

CLINICAL DATA: Chronic shoulder pain

EXAM:
LEFT SHOULDER - 2+ VIEW

[shoulder grashey]
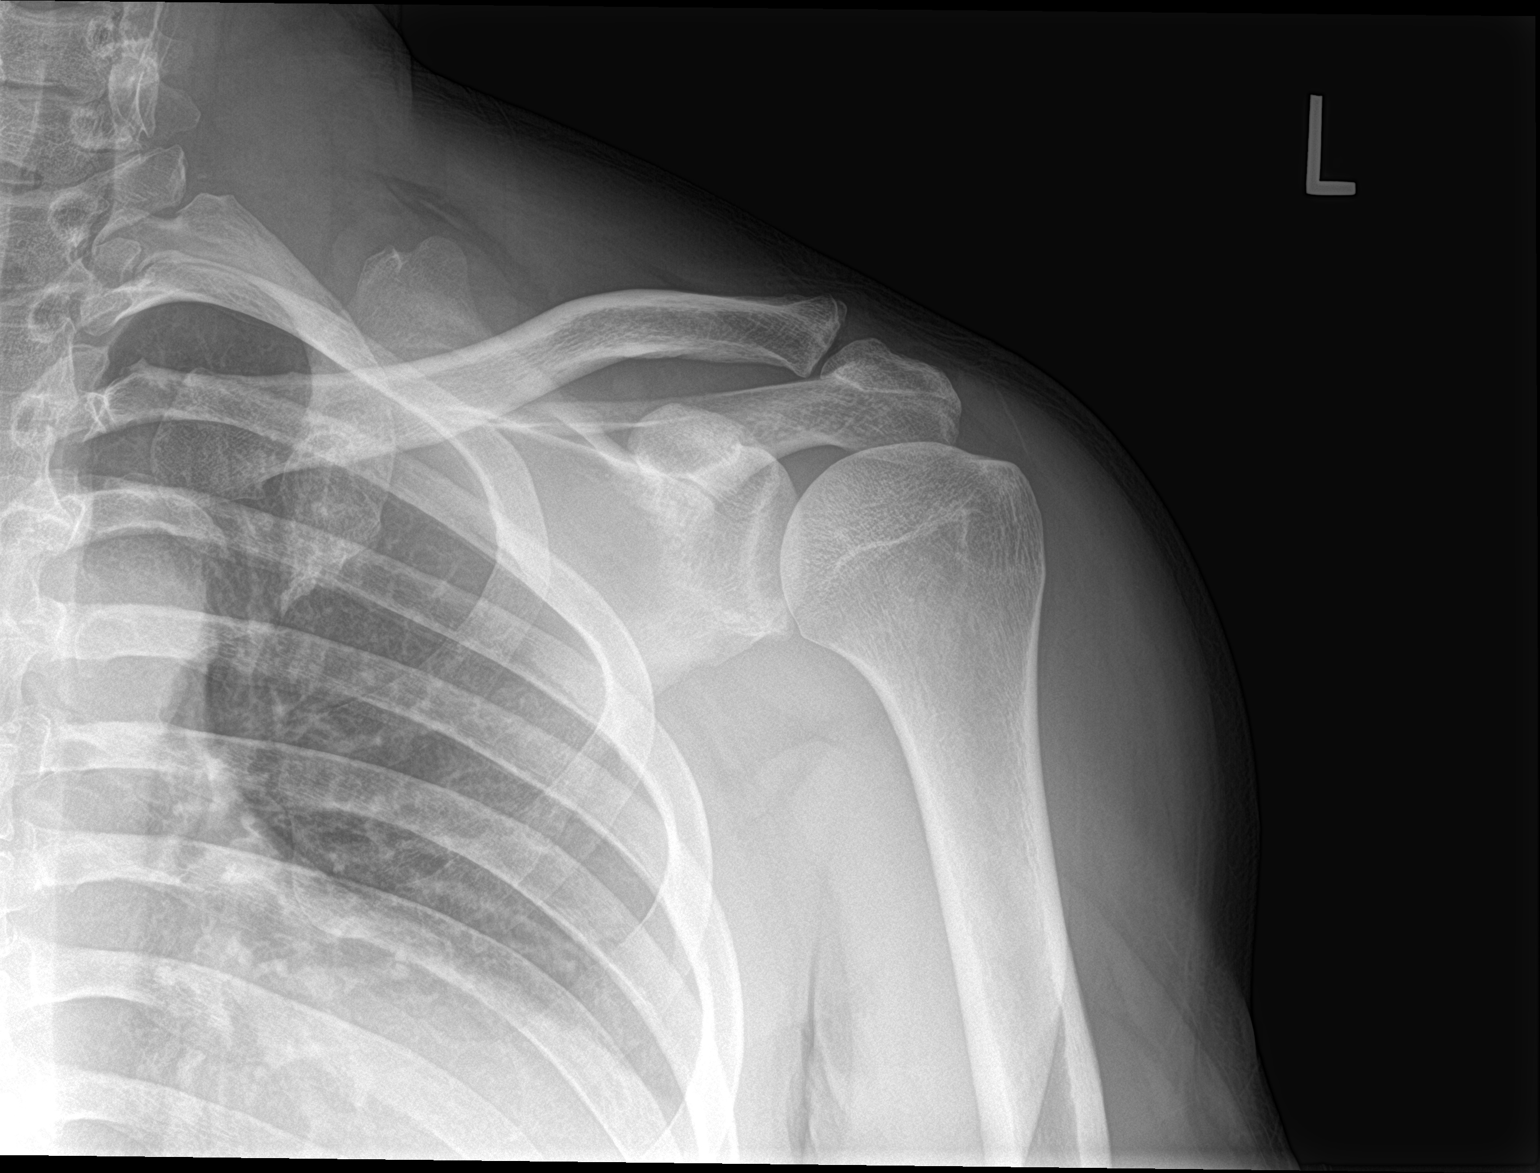

[shoulder y view]
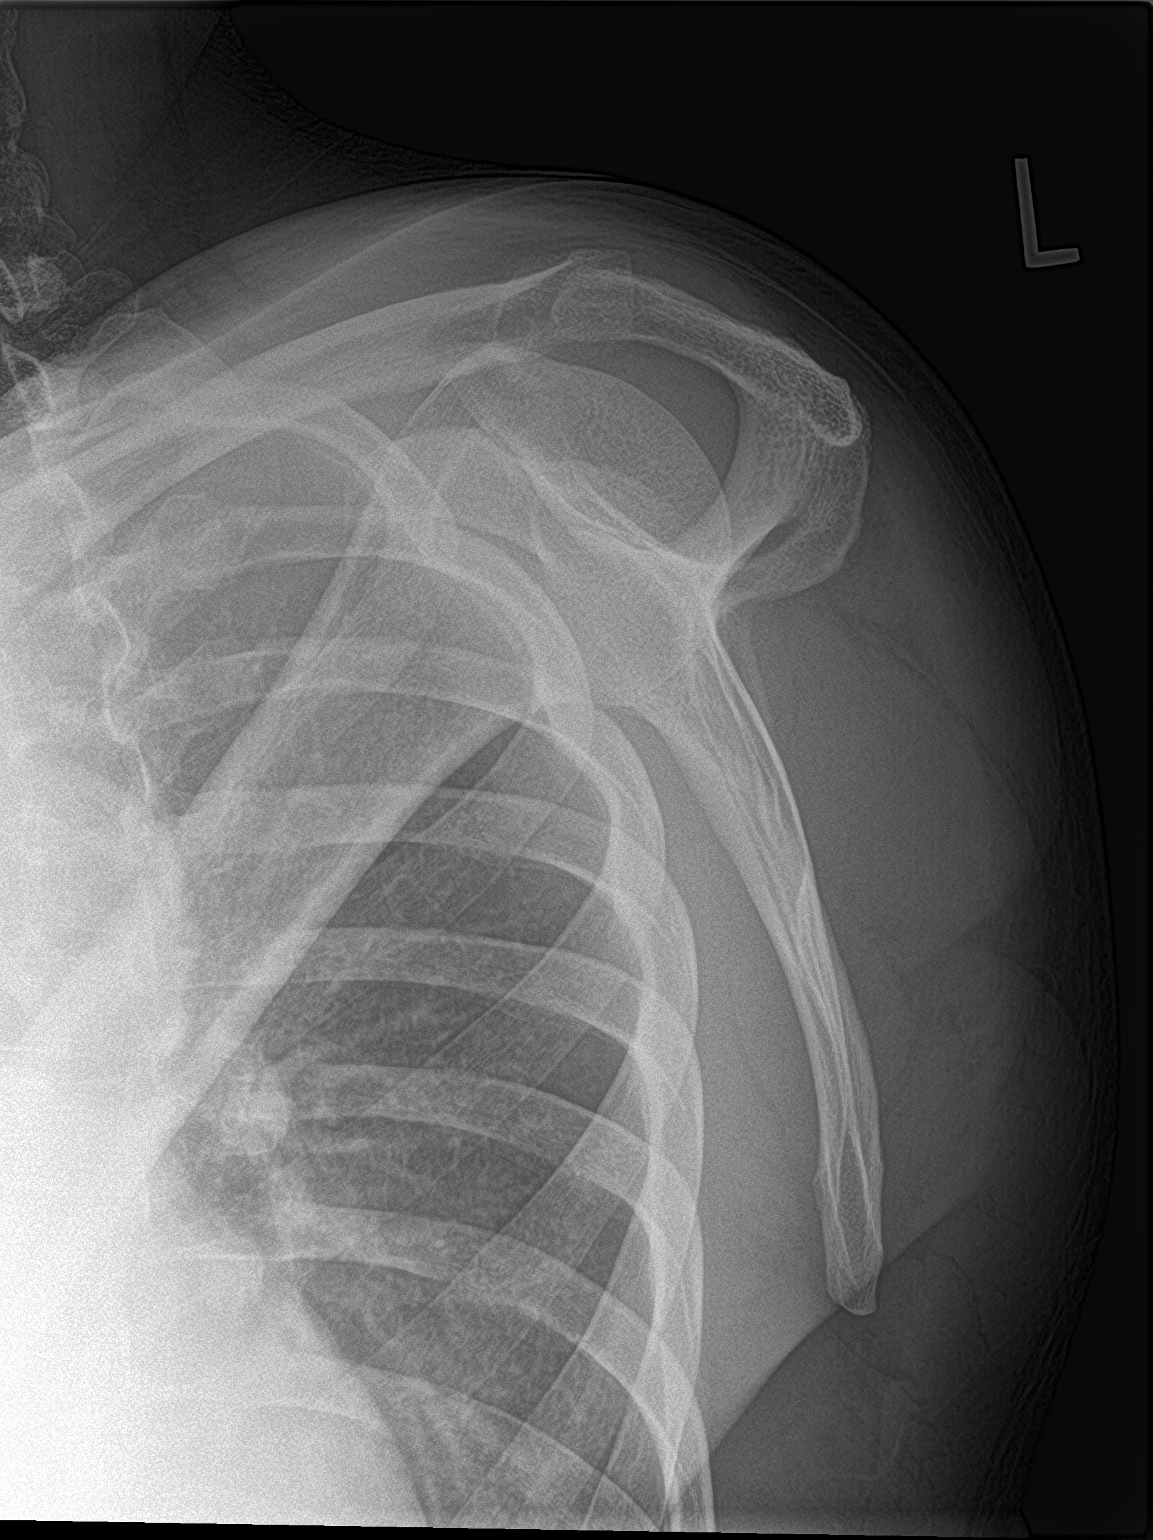

[shoulder axillary]
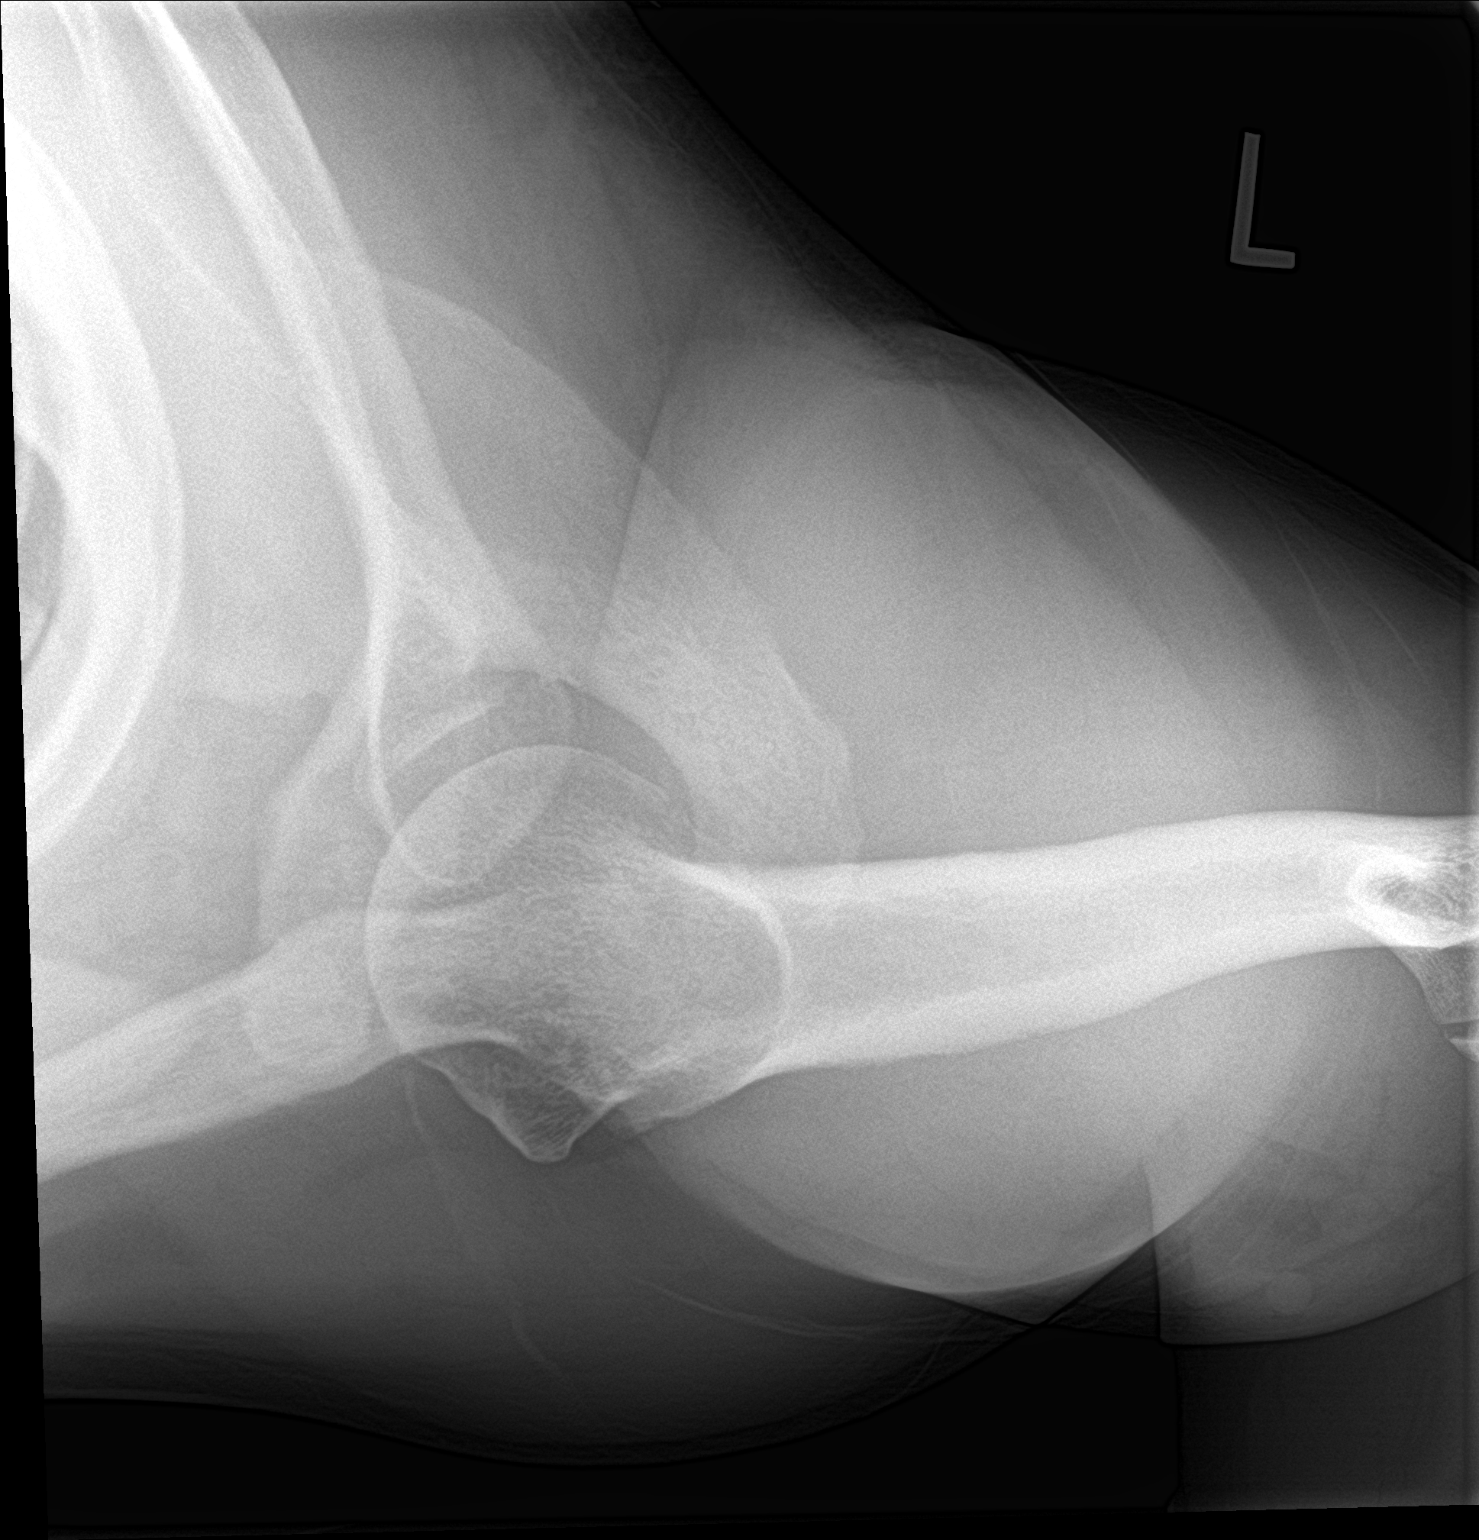

[3 of 3 positions shown; findings below may reference images not displayed]

FINDINGS: No fracture or malalignment. Minimal AC joint degenerative change.
The left lung apex is clear
IMPRESSION: Minimal AC joint degenerative change.

## 2022-02-02 ENCOUNTER — Other Ambulatory Visit: Payer: Self-pay

## 2022-02-02 DIAGNOSIS — M5030 Other cervical disc degeneration, unspecified cervical region: Secondary | ICD-10-CM

## 2022-02-02 MED ORDER — MELOXICAM 15 MG PO TABS: ORAL_TABLET | ORAL | 0 refills | Status: DC

## 2022-06-21 ENCOUNTER — Other Ambulatory Visit: Payer: Self-pay | Admitting: Sports Medicine

## 2022-06-21 DIAGNOSIS — M5030 Other cervical disc degeneration, unspecified cervical region: Secondary | ICD-10-CM

## 2022-06-24 ENCOUNTER — Encounter: Payer: Self-pay | Admitting: Family Medicine

## 2022-06-24 ENCOUNTER — Ambulatory Visit (INDEPENDENT_AMBULATORY_CARE_PROVIDER_SITE_OTHER): Payer: 59 | Admitting: Family Medicine

## 2022-06-24 VITALS — BP 133/85 | Ht 73.0 in | Wt 263.0 lb

## 2022-06-24 DIAGNOSIS — S39012A Strain of muscle, fascia and tendon of lower back, initial encounter: Secondary | ICD-10-CM | POA: Insufficient documentation

## 2022-06-24 MED ORDER — METHYLPREDNISOLONE ACETATE 40 MG/ML IJ SUSP
40.0000 mg | Freq: Once | INTRAMUSCULAR | Status: AC
  Administered 2022-06-24: 40 mg via INTRAMUSCULAR

## 2022-06-24 MED ORDER — KETOROLAC TROMETHAMINE 30 MG/ML IJ SOLN
30.0000 mg | Freq: Once | INTRAMUSCULAR | Status: AC
  Administered 2022-06-24: 30 mg via INTRAMUSCULAR

## 2022-06-24 NOTE — Progress Notes (Signed)
To Whom it may concern  :    Please excuse William Foley from Work / School today, he is good to be returning to work  9/1 regular duties. Contact our office if there are any questions or concerns.         Sincerely,    Myra Rude

## 2022-06-24 NOTE — Assessment & Plan Note (Signed)
Acutely occurring.  Symptoms seem more consistent with a strain at this time.  No radicular pain.  No history of chickenpox to suggest shingles.  Not consistent with a kidney stone. -Counseled on home exercise therapy and supportive care. -IM Depo-Medrol and Toradol. -Could consider physical therapy.

## 2022-06-24 NOTE — Progress Notes (Signed)
  William Foley - 41 y.o. male MRN [id number]  Date of birth: [birth year]  SUBJECTIVE:  Including CC & ROS.  No chief complaint on file.   William Foley is a 41 y.o. male that is presenting with acute left-sided low back pain.  The pain is radiating anteriorly.  Denies any changes in voiding.  No history of back surgery.  No history of chickenpox.  Independent review of the lumbar spine MRI from 2021 shows a large left-sided herniated disc at L5-S1.Marland Kitchen  Review of Systems See HPI   HISTORY: Past Medical, Surgical, Social, and Family History Reviewed & Updated per EMR.   Pertinent Historical Findings include:  Past Medical History:  Diagnosis Date   Former smoker    Hypertension    Obesity    Transaminitis 12/18/2018    Past Surgical History:  Procedure Laterality Date   KNEE SURGERY Right      PHYSICAL EXAM:  VS: BP 133/85 (BP Location: Left Arm, Patient Position: Sitting)   Ht 6\' 1"  (1.854 m)   Wt 263 lb (119.3 kg)   BMI 34.70 kg/m  Physical Exam Gen: NAD, alert, cooperative with exam, well-appearing MSK:  Neurovascularly intact       ASSESSMENT & PLAN:   Strain of lumbar region Acutely occurring.  Symptoms seem more consistent with a strain at this time.  No radicular pain.  No history of chickenpox to suggest shingles.  Not consistent with a kidney stone. -Counseled on home exercise therapy and supportive care. -IM Depo-Medrol and Toradol. -Could consider physical therapy.

## 2022-06-24 NOTE — Patient Instructions (Signed)
Nice to meet you Please try heat  Please try the stretches   Please send me a message in MyChart with any questions or updates.  Please see me or Dr. Karie Schwalbe back in 1-2 weeks if ongoing .   --Dr. Jordan Likes

## 2022-08-26 ENCOUNTER — Ambulatory Visit: Payer: 59 | Admitting: Sports Medicine

## 2022-08-30 ENCOUNTER — Ambulatory Visit (INDEPENDENT_AMBULATORY_CARE_PROVIDER_SITE_OTHER): Payer: 59 | Admitting: Sports Medicine

## 2022-08-30 VITALS — Wt 262.0 lb

## 2022-08-30 DIAGNOSIS — M5030 Other cervical disc degeneration, unspecified cervical region: Secondary | ICD-10-CM | POA: Diagnosis not present

## 2022-08-30 MED ORDER — IBUPROFEN 800 MG PO TABS: 800.0000 mg | ORAL_TABLET | Freq: Three times a day (TID) | ORAL | 3 refills | Status: DC | PRN

## 2022-08-30 NOTE — Assessment & Plan Note (Addendum)
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 He does get some occasional left periscapular radicular discomfort, at this point he has had x-rays confirming C5-C6 DDD, he has had prednisone, meloxicam. Due to persistent discomfort we will proceed with cervical spine MRI for epidural planning, likely left C6-C7 interlaminar. He would like me to go ahead and order the epidural injection as soon as I see the MRI results. Switching from meloxicam to ibuprofen as this seemed to work better for him in the past.  For insurance approval purposes he has failed greater than 6 weeks of physician directed conservative treatment, and has already had x-rays confirming C5-C6 DDD.  Update: Epidural ordered, left C6-C7 interlaminar, follow-up 6 weeks after epidural.

## 2022-08-30 NOTE — Progress Notes (Addendum)
    Procedures performed today:    None.  Independent interpretation of notes and tests performed by another provider:   None.  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 He does get some occasional left periscapular radicular discomfort, at this point he has had x-rays confirming C5-C6 DDD, he has had prednisone, meloxicam. Due to persistent discomfort we will proceed with cervical spine MRI for epidural planning, likely left C6-C7 interlaminar. He would like me to go ahead and order the epidural injection as soon as I see the MRI results. Switching from meloxicam to ibuprofen as this seemed to work better for him in the past.  For insurance approval purposes he has failed greater than 6 weeks of physician directed conservative treatment, and has already had x-rays confirming C5-C6 DDD.  Update: Epidural ordered, left C6-C7 interlaminar, follow-up 6 weeks after epidural.  Chronic process with exacerbation and pharmacologic intervention  ____________________________________________ William Foley. Dianah Field, M.D., ABFM., CAQSM., AME. Primary Care and Sports Medicine  [facility]  Adjunct Professor of [employer]  Risk manager

## 2022-09-02 ENCOUNTER — Telehealth: Payer: Self-pay

## 2022-09-02 NOTE — Telephone Encounter (Signed)
Insurance is requesting a physician to physician discussion regarding the medical necessity of MRI. The ordering physician must call [phone]. Case/Ref #[id number].

## 2022-09-03 NOTE — Telephone Encounter (Signed)
Task completed. Referral auth updated. Imaging dept has been notified to contact patient for scheduling.

## 2022-09-03 NOTE — Telephone Encounter (Signed)
Approval number: [id number] exp: 10/18/22.  Please schedule.

## 2022-09-05 ENCOUNTER — Ambulatory Visit (INDEPENDENT_AMBULATORY_CARE_PROVIDER_SITE_OTHER): Payer: 59

## 2022-09-05 DIAGNOSIS — M542 Cervicalgia: Secondary | ICD-10-CM | POA: Diagnosis not present

## 2022-09-05 DIAGNOSIS — G8929 Other chronic pain: Secondary | ICD-10-CM | POA: Diagnosis not present

## 2022-09-05 DIAGNOSIS — M5030 Other cervical disc degeneration, unspecified cervical region: Secondary | ICD-10-CM | POA: Diagnosis not present

## 2022-09-07 NOTE — Addendum Note (Signed)
Addended by: Monica Becton on: 09/07/2022 12:35 PM   Modules accepted: Orders

## 2022-09-21 ENCOUNTER — Other Ambulatory Visit: Payer: 59

## 2022-10-01 ENCOUNTER — Ambulatory Visit
Admission: RE | Admit: 2022-10-01 | Discharge: 2022-10-01 | Disposition: A | Discharge status: Home / Self Care | Payer: 59 | Source: Ambulatory Visit | Attending: Sports Medicine | Admitting: Sports Medicine

## 2022-10-01 DIAGNOSIS — M5030 Other cervical disc degeneration, unspecified cervical region: Secondary | ICD-10-CM

## 2022-10-01 MED ORDER — TRIAMCINOLONE ACETONIDE 40 MG/ML IJ SUSP (RADIOLOGY)
60.0000 mg | Freq: Once | INTRAMUSCULAR | Status: AC
  Administered 2022-10-01: 60 mg via EPIDURAL

## 2022-10-01 MED ORDER — IOPAMIDOL (ISOVUE-M 300) INJECTION 61%
1.0000 mL | Freq: Once | INTRAMUSCULAR | Status: AC
  Administered 2022-10-01: 1 mL via EPIDURAL

## 2022-10-01 NOTE — Discharge Instructions (Signed)

## 2023-02-07 ENCOUNTER — Encounter: Payer: Self-pay | Admitting: *Deleted

## 2023-06-12 ENCOUNTER — Other Ambulatory Visit: Payer: Self-pay

## 2023-06-12 ENCOUNTER — Encounter: Payer: Self-pay | Admitting: Physician Assistant

## 2023-06-12 ENCOUNTER — Ambulatory Visit
Admission: EM | Admit: 2023-06-12 | Discharge: 2023-06-12 | Disposition: A | Discharge status: Home / Self Care | Payer: 59 | Attending: Physician Assistant | Admitting: Physician Assistant

## 2023-06-12 DIAGNOSIS — R519 Headache, unspecified: Secondary | ICD-10-CM

## 2023-06-12 MED ORDER — PREDNISONE 10 MG (21) PO TBPK: ORAL_TABLET | ORAL | 0 refills | Status: DC

## 2023-06-12 NOTE — ED Provider Notes (Signed)
[facility]    CSN: [id number] Arrival date & time: 06/12/23  1149      History   Chief Complaint No chief complaint on file.   HPI [name] is a 42 y.o. male.   Patient presents today companied by his wife who help provide the history.  Reports a 24 to 36-hour history of right temporal pain.  Initially he had a throbbing sensation in his ear in the area just anterior to the ear along his temple.  This is since improved but he continues to have a pain anterior to the ear that spreads to periorbital area.  Symptoms are localized to the right side and he has no discomfort on the left.  He does report that he feels his vision is slightly different though visual acuity in clinic is 20/20 bilaterally with glasses.  He denies any history of migraines or neurological condition.  Denies any associated fever, cough, congestion, shortness of breath, nausea/vomiting.  He does have some ongoing neck pain but this is chronic and being evaluated by his sports medicine provider.  Reports is unchanged from baseline.  He denies any recent medication changes or head trauma.  Denies any jaw claudication.  He does report an increase sensitivity to his scalp and the skin surrounding his right eye but denies any significant pain.  He is unsure if he had chickenpox when he was younger but denies any associated rash.    Past Medical History:  Diagnosis Date   Former smoker    Hypertension    Obesity    Transaminitis 12/18/2018    Patient Active Problem List   Diagnosis Date Noted   Strain of lumbar region 06/24/2022   DDD (degenerative disc disease), cervical 08/25/2021   Bilateral shoulder pain 02/09/2021   Lumbar degenerative disc disease 03/14/2020   Transaminitis 12/18/2018   Hypertriglyceridemia 12/22/2017   Former smoker 12/16/2017   Class 1 obesity due to excess calories in adult 12/16/2017   Paresthesias 12/16/2017   Hypertension goal BP (blood pressure) < 130/80 12/16/2017    Family history of hemophilia 12/16/2017    Past Surgical History:  Procedure Laterality Date   KNEE SURGERY Right        Home Medications    Prior to Admission medications   Medication Sig Start Date End Date Taking? Authorizing Provider  predniSONE (STERAPRED UNI-PAK 21 TAB) 10 MG (21) TBPK tablet As directed 06/12/23  Yes Darryl Blumenstein K, PA-C  ibuprofen (ADVIL) 800 MG tablet Take 1 tablet (800 mg total) by mouth every 8 (eight) hours as needed. 08/30/22   Monica Becton, MD    Family History Family History  Problem Relation Age of Onset   Von Willebrand disease Mother    Hyperlipidemia Father    Hypertension Father    Hemophilia Brother    Von Willebrand disease Maternal Aunt    Lung cancer Maternal Grandfather    Bone cancer Paternal Grandmother     Social History Social History   Tobacco Use   Smoking status: Former    Current packs/day: 0.00    Average packs/day: 0.5 packs/day for 8.0 years (4.0 ttl pk-yrs)    Types: Cigarettes    Start date: 12/16/2008    Quit date: 12/16/2016    Years since quitting: 6.4   Smokeless tobacco: Former    Types: Engineer, drilling   Vaping status: Never Used  Substance Use Topics   Alcohol use: Yes    Alcohol/week: 3.0 standard drinks of  alcohol    Types: 3 Standard drinks or equivalent per week   Drug use: Never     Allergies   Patient has no known allergies.   Review of Systems Review of Systems  Constitutional:  Positive for activity change. Negative for appetite change, fatigue and fever.  Eyes:  Positive for visual disturbance. Negative for photophobia.  Respiratory:  Negative for shortness of breath.   Cardiovascular:  Negative for chest pain.  Gastrointestinal:  Negative for abdominal pain, diarrhea, nausea and vomiting.  Musculoskeletal:  Negative for arthralgias and myalgias.  Skin:  Negative for rash.  Neurological:  Positive for headaches. Negative for dizziness, seizures, syncope, facial  asymmetry, speech difficulty, weakness, light-headedness and numbness.     Physical Exam Triage Vital Signs ED Triage Vitals  Encounter Vitals Group     BP 06/12/23 1157 (!) 138/90     Systolic BP Percentile --      Diastolic BP Percentile --      Pulse Rate 06/12/23 1157 90     Resp 06/12/23 1157 16     Temp 06/12/23 1157 97.7 F (36.5 C)     Temp Source 06/12/23 1157 Oral     SpO2 06/12/23 1157 98 %     Weight --      Height --      Head Circumference --      Peak Flow --      Pain Score 06/12/23 1158 4     Pain Loc --      Pain Education --      Exclude from Growth Chart --    No data found.  Updated Vital Signs BP (!) 138/90 (BP Location: Right Arm)   Pulse 90   Temp 97.7 F (36.5 C) (Oral)   Resp 16   SpO2 98%   Visual Acuity Right Eye Distance:   Left Eye Distance:   Bilateral Distance:    Right Eye Near:   Left Eye Near:    Bilateral Near:     Physical Exam Vitals reviewed.  Constitutional:      General: He is awake.     Appearance: Normal appearance. He is well-developed. He is not ill-appearing.     Comments: Very pleasant male appears stated age in no acute distress sitting comfortably in exam room  HENT:     Head: Normocephalic and atraumatic.     Right Ear: Tympanic membrane, ear canal and external ear normal. No hemotympanum.     Left Ear: Tympanic membrane, ear canal and external ear normal. No hemotympanum.     Nose: Nose normal.     Mouth/Throat:     Pharynx: Uvula midline. No oropharyngeal exudate or posterior oropharyngeal erythema.  Eyes:     Extraocular Movements: Extraocular movements intact.     Conjunctiva/sclera: Conjunctivae normal.     Pupils: Pupils are equal, round, and reactive to light.  Cardiovascular:     Rate and Rhythm: Normal rate and regular rhythm.     Heart sounds: Normal heart sounds, S1 normal and S2 normal. No murmur heard. Pulmonary:     Effort: Pulmonary effort is normal. No accessory muscle usage or  respiratory distress.     Breath sounds: Normal breath sounds. No stridor. No wheezing, rhonchi or rales.     Comments: Clear to auscultation bilaterally Musculoskeletal:     Cervical back: Normal range of motion and neck supple.     Comments: Strength 5/5 bilateral upper and lower extremities  Skin:  Findings: No rash.  Neurological:     General: No focal deficit present.     Mental Status: He is alert and oriented to person, place, and time.     Cranial Nerves: Cranial nerves 2-12 are intact.     Motor: Motor function is intact.     Coordination: Coordination is intact.     Gait: Gait is intact.     Comments: Cranial nerves II through XII grossly intact.  No focal logical defect identified on exam.  Psychiatric:        Behavior: Behavior is cooperative.      UC Treatments / Results  Labs (all labs ordered are listed, but only abnormal results are displayed) Labs Reviewed  CBC WITH DIFFERENTIAL/PLATELET  COMPREHENSIVE METABOLIC PANEL  SEDIMENTATION RATE  C-REACTIVE PROTEIN    EKG   Radiology No results found.  Procedures Procedures (including critical care time)  Medications Ordered in UC Medications - No data to display  Initial Impression / Assessment and Plan / UC Course  I have reviewed the triage vital signs and the nursing notes.  Pertinent labs & imaging results that were available during my care of the patient were reviewed by me and considered in my medical decision making (see chart for details).     Patient is well-appearing, afebrile, nontoxic, nontachycardic.  Physical exam is reassuring with no focal neurologic defect or abnormality.  Unclear etiology of symptoms.  Low suspicion for trigeminal neuralgia given description of pain as well as distribution.  I am slightly concerned for temporal arteritis given tenderness over his temporal artery.  Basic labs including CBC, CMP, ESR/CRP were obtained and are pending.  If inflammatory markers are positive  we will send referral to vascular surgeon.  Will treat with prednisone taper and he was instructed to avoid additional NSAIDs including previously prescribed ibuprofen due to risk of GI bleeding.  He can use Tylenol for breakthrough pain.  We did discuss that symptoms could be prodrome of herpes zoster though again low suspicion for this as he does not currently have any rash and symptoms follow more than 1 dermatome but we did discuss that if he develops any rash he should be seen immediately due to concern for ocular involvement.  Recommended close follow-up with his primary care.  We discussed that if anything changes or worsens and he develops vision change, severe headache, worsening of his life, rash, nausea/vomiting, weakness he needs to be seen immediately.  Strict return precautions given.  All questions answered to patient and wife satisfaction.  Final Clinical Impressions(s) / UC Diagnoses   Final diagnoses:  Right sided temporal headache     Discharge Instructions      We will contact you if any of your lab work is abnormal.  Please start prednisone taper as prescribed.  Do not take NSAIDs including the previously prescribed ibuprofen or additional ibuprofen/Motrin/Advil, naproxen/Aleve, aspirin with this medication as it can cause stomach bleeding.  You can use Tylenol as needed for additional pain relief.  Follow-up with your primary care for further evaluation and management.  If you develop any rash, increasing pain, vision change, fever, nausea, vomiting you need to be seen immediately.     ED Prescriptions     Medication Sig Dispense Auth. Provider   predniSONE (STERAPRED UNI-PAK 21 TAB) 10 MG (21) TBPK tablet As directed 21 tablet Lucine Bilski K, PA-C      PDMP not reviewed this encounter.   Jeani Hawking, PA-C 06/12/23 1224

## 2023-06-12 NOTE — ED Triage Notes (Signed)
C/o pain to right ear, right side of face under eye x 2 days. Denies fever.

## 2023-06-12 NOTE — ED Notes (Signed)
Visual acuity: Uncorrected: OD 20/70, OS 20/50, OU 20/50 Corrected with glasses: OD 20/20, OS 20/20, OU 20/20

## 2023-06-12 NOTE — Discharge Instructions (Signed)
We will contact you if any of your lab work is abnormal.  Please start prednisone taper as prescribed.  Do not take NSAIDs including the previously prescribed ibuprofen or additional ibuprofen/Motrin/Advil, naproxen/Aleve, aspirin with this medication as it can cause stomach bleeding.  You can use Tylenol as needed for additional pain relief.  Follow-up with your primary care for further evaluation and management.  If you develop any rash, increasing pain, vision change, fever, nausea, vomiting you need to be seen immediately.

## 2023-06-13 LAB — CBC WITH DIFFERENTIAL/PLATELET
Basophils Absolute: 0.1 10*3/uL (ref 0.0–0.2)
Basos: 1 %
EOS (ABSOLUTE): 0.3 10*3/uL (ref 0.0–0.4)
Eos: 4 %
Hematocrit: 43.9 % (ref 37.5–51.0)
Hemoglobin: 15.1 g/dL (ref 13.0–17.7)
Immature Grans (Abs): 0 10*3/uL (ref 0.0–0.1)
Immature Granulocytes: 0 %
Lymphocytes Absolute: 2.3 10*3/uL (ref 0.7–3.1)
Lymphs: 31 %
MCH: 29.5 pg (ref 26.6–33.0)
MCHC: 34.4 g/dL (ref 31.5–35.7)
MCV: 86 fL (ref 79–97)
Monocytes Absolute: 0.6 10*3/uL (ref 0.1–0.9)
Monocytes: 8 %
Neutrophils Absolute: 4.2 10*3/uL (ref 1.4–7.0)
Neutrophils: 56 %
Platelets: 289 10*3/uL (ref 150–450)
RBC: 5.12 x10E6/uL (ref 4.14–5.80)
RDW: 11.9 % (ref 11.6–15.4)
WBC: 7.4 10*3/uL (ref 3.4–10.8)

## 2023-06-13 LAB — SEDIMENTATION RATE: Sed Rate: 6 mm/h (ref 0–15)

## 2023-06-13 LAB — C-REACTIVE PROTEIN: CRP: 2 mg/L (ref 0–10)

## 2023-06-14 LAB — COMPREHENSIVE METABOLIC PANEL
ALT: 26 IU/L (ref 0–44)
AST: 22 IU/L (ref 0–40)
Albumin: 4.5 g/dL (ref 4.1–5.1)
Alkaline Phosphatase: 55 IU/L (ref 44–121)
BUN/Creatinine Ratio: 16 (ref 9–20)
BUN: 16 mg/dL (ref 6–20)
Bilirubin Total: 0.3 mg/dL (ref 0.0–1.2)
CO2: 20 mmol/L (ref 20–29)
Calcium: 9.8 mg/dL (ref 8.7–10.2)
Chloride: 100 mmol/L (ref 96–106)
Creatinine, Ser: 0.98 mg/dL (ref 0.76–1.27)
Globulin, Total: 2.6 g/dL (ref 1.5–4.5)
Glucose: 103 mg/dL — ABNORMAL HIGH (ref 70–99)
Potassium: 4.2 mmol/L (ref 3.5–5.2)
Sodium: 136 mmol/L (ref 134–144)
Total Protein: 7.1 g/dL (ref 6.0–8.5)
eGFR: 101 mL/min/{1.73_m2} (ref >59)

## 2023-09-06 ENCOUNTER — Other Ambulatory Visit: Payer: Self-pay | Admitting: Sports Medicine

## 2023-09-06 DIAGNOSIS — M5030 Other cervical disc degeneration, unspecified cervical region: Secondary | ICD-10-CM

## 2023-09-23 ENCOUNTER — Other Ambulatory Visit: Payer: Self-pay | Admitting: Sports Medicine

## 2023-09-23 DIAGNOSIS — M5030 Other cervical disc degeneration, unspecified cervical region: Secondary | ICD-10-CM

## 2023-11-29 ENCOUNTER — Ambulatory Visit (INDEPENDENT_AMBULATORY_CARE_PROVIDER_SITE_OTHER): Payer: 59

## 2023-11-29 ENCOUNTER — Encounter: Payer: Self-pay | Admitting: Sports Medicine

## 2023-11-29 ENCOUNTER — Ambulatory Visit (INDEPENDENT_AMBULATORY_CARE_PROVIDER_SITE_OTHER): Payer: 59 | Admitting: Sports Medicine

## 2023-11-29 DIAGNOSIS — M25531 Pain in right wrist: Secondary | ICD-10-CM

## 2023-11-29 DIAGNOSIS — M25532 Pain in left wrist: Secondary | ICD-10-CM

## 2023-11-29 MED ORDER — IBUPROFEN 800 MG PO TABS: 800.0000 mg | ORAL_TABLET | Freq: Three times a day (TID) | ORAL | 2 refills | Status: AC | PRN

## 2023-11-29 MED ORDER — MELOXICAM 15 MG PO TABS: ORAL_TABLET | ORAL | 3 refills | Status: DC

## 2023-11-29 NOTE — Assessment & Plan Note (Addendum)
 This is a very pleasant 43 year old male, he works as an personnel officer. He has had many months of pain bilateral left worse than right wrist at the base of the thumb, no trauma, no mechanical symptoms. On exam he localizes the pain at the radiocarpal joint, radial aspect. He has no visible swelling, he has good motion and good strength, on exam he has a negative Finkelstein sign, he does not have any pain at the thumb basal joint. He does have a minimally positive shuck test. Question scapholunate or lunotriquetral disease versus triscaphe osteoarthritis. Adding bilateral wrist x-rays, ibuprofen , he will wear a brace on his left wrist at night, home PT given, return to see me in 6 weeks, MR arthrography if not better, we did discuss the anatomy and the reason for articular contrast if we do an MRI.

## 2023-11-29 NOTE — Addendum Note (Signed)
Addended by: Monica Becton on: 11/29/2023 04:26 PM   Modules accepted: Orders

## 2023-11-29 NOTE — Progress Notes (Addendum)
    Procedures performed today:    None.  Independent interpretation of notes and tests performed by another provider:   None.  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 On exam he localizes the pain at the radiocarpal joint, radial aspect. He has no visible swelling, he has good motion and good strength, on exam he has a negative Finkelstein sign, he does not have any pain at the thumb basal joint. He does have a minimally positive shuck test. Question scapholunate or lunotriquetral disease versus triscaphe osteoarthritis. Adding bilateral wrist x-rays, ibuprofen , he will wear a brace on his left wrist at night, home PT given, return to see me in 6 weeks, MR arthrography if not better, we did discuss the anatomy and the reason for articular contrast if we do an MRI.    ____________________________________________ Debby PARAS. Curtis, M.D., ABFM., CAQSM., AME. Primary Care and Sports Medicine [facility]  Adjunct Professor of [facility] Medicine  [employer]  [employer], [employer]

## 2023-11-30 ENCOUNTER — Other Ambulatory Visit: Payer: Self-pay | Admitting: Sports Medicine

## 2023-12-02 ENCOUNTER — Telehealth: Payer: Self-pay

## 2023-12-02 DIAGNOSIS — M25531 Pain in right wrist: Secondary | ICD-10-CM

## 2023-12-02 NOTE — Telephone Encounter (Signed)
 Copied from CRM [id number]. Topic: Clinical - Prescription Issue >> Dec 02, 2023  5:04 PM Delon DASEN wrote: Reason for CRM: Did not receive call back about prescriptions- predniSONE  (STERAPRED UNI-PAK 21 TAB) 10 MG was not called in- the pharmacy received request for Meloxicam  and Ibuprofen  and he told them to keep the Meloxicam . He states Dr T replaced Meloxicam  with Prednisone   Please call patient [phone]

## 2023-12-02 NOTE — Telephone Encounter (Signed)
 Copied from CRM [id number]. Topic: Clinical - Medication Refill >> Dec 02, 2023 10:59 AM Tonda B wrote: Most Recent Primary Care Visit:  Provider: CURTIS DEBBY PARAS  Department: [facility]  Visit Type: OFFICE VISIT  Date: 08/30/2022  Medication: predniSONE  (STERAPRED UNI-PAK 21 TAB) 10 MG  Has the patient contacted their pharmacy? Yes (Agent: If no, request that the patient contact the pharmacy for the refill. If patient does not wish to contact the pharmacy document the reason why and proceed with request.) (Agent: If yes, when and what did the pharmacy advise?)  Is this the correct pharmacy for this prescription? Yes If no, delete pharmacy and type the correct one.  This is the patient's preferred pharmacy:  [id number] - [place], [place] - [address] [place] [address] Phone: [phone] Fax: [phone]   Has the prescription been filled recently? Yes  Is the patient out of the medication? Yes  Has the patient been seen for an appointment in the last year OR does the patient have an upcoming appointment? Yes  Can we respond through MyChart? No  Agent: Please be advised that Rx refills may take up to 3 business days. We ask that you follow-up with your pharmacy.

## 2023-12-04 MED ORDER — PREDNISONE 10 MG (48) PO TBPK: ORAL_TABLET | Freq: Every day | ORAL | 0 refills | Status: DC

## 2023-12-04 NOTE — Telephone Encounter (Signed)
 I do not recall the discussion of prednisone  but I am happy to call it in.  Hold off on the anti-inflammatories until prednisone  finished.

## 2023-12-05 NOTE — Telephone Encounter (Signed)
 Patient informed.

## 2024-01-10 ENCOUNTER — Ambulatory Visit: Payer: 59 | Admitting: Sports Medicine

## 2024-01-13 ENCOUNTER — Other Ambulatory Visit: Payer: Self-pay

## 2024-01-13 ENCOUNTER — Ambulatory Visit
Admission: EM | Admit: 2024-01-13 | Discharge: 2024-01-13 | Disposition: A | Discharge status: Home / Self Care | Attending: Family Medicine | Admitting: Family Medicine

## 2024-01-13 DIAGNOSIS — H6691 Otitis media, unspecified, right ear: Secondary | ICD-10-CM | POA: Diagnosis not present

## 2024-01-13 DIAGNOSIS — J029 Acute pharyngitis, unspecified: Secondary | ICD-10-CM

## 2024-01-13 MED ORDER — AMOXICILLIN-POT CLAVULANATE 875-125 MG PO TABS: 1.0000 | ORAL_TABLET | Freq: Two times a day (BID) | ORAL | 0 refills | Status: AC

## 2024-01-13 NOTE — ED Triage Notes (Signed)
 C/o sore throat and right ear pain x 1-2 weeks

## 2024-01-13 NOTE — ED Provider Notes (Signed)
 [facility]    CSN: [id number] Arrival date & time: 01/13/24  1841      History   Chief Complaint Chief Complaint  Patient presents with   Ear Fullness   Sore Throat    HPI William Foley is a 43 y.o. male.   HPI 43 year old male presents with sore throat and right ear pain for 1-2 weeks.  PMH significant for obesity, HTN, and former smoker.  Past Medical History:  Diagnosis Date   Former smoker    Hypertension    Obesity    Transaminitis 12/18/2018    Patient Active Problem List   Diagnosis Date Noted   Bilateral wrist pain 11/29/2023   Strain of lumbar region 06/24/2022   DDD (degenerative disc disease), cervical 08/25/2021   Bilateral shoulder pain 02/09/2021   Lumbar degenerative disc disease 03/14/2020   Transaminitis 12/18/2018   Hypertriglyceridemia 12/22/2017   Former smoker 12/16/2017   Class 1 obesity due to excess calories in adult 12/16/2017   Paresthesias 12/16/2017   Hypertension goal BP (blood pressure) < 130/80 12/16/2017   Family history of hemophilia 12/16/2017    Past Surgical History:  Procedure Laterality Date   KNEE SURGERY Right        Home Medications    Prior to Admission medications   Medication Sig Start Date End Date Taking? Authorizing Provider  amoxicillin-clavulanate (AUGMENTIN) 875-125 MG tablet Take 1 tablet by mouth 2 (two) times daily for 10 days. 01/13/24 01/23/24 Yes Trevor Iha, FNP  ibuprofen (ADVIL) 800 MG tablet Take 1 tablet (800 mg total) by mouth every 8 (eight) hours as needed. 11/29/23   Monica Becton, MD    Family History Family History  Problem Relation Age of Onset   Von Willebrand disease Mother    Hyperlipidemia Father    Hypertension Father    Hemophilia Brother    Von Willebrand disease Maternal Aunt    Lung cancer Maternal Grandfather    Bone cancer Paternal Grandmother     Social History Social History   Tobacco Use   Smoking status: Former    Current packs/day:  0.00    Average packs/day: 0.5 packs/day for 8.0 years (4.0 ttl pk-yrs)    Types: Cigarettes    Start date: 12/16/2008    Quit date: 12/16/2016    Years since quitting: 7.0   Smokeless tobacco: Former    Types: Engineer, drilling   Vaping status: Never Used  Substance Use Topics   Alcohol use: Yes    Alcohol/week: 3.0 standard drinks of alcohol    Types: 3 Standard drinks or equivalent per week   Drug use: Never     Allergies   Patient has no known allergies.   Review of Systems Review of Systems  HENT:  Positive for ear pain and sore throat.   All other systems reviewed and are negative.    Physical Exam Triage Vital Signs ED Triage Vitals  Encounter Vitals Group     BP      Systolic BP Percentile      Diastolic BP Percentile      Pulse      Resp      Temp      Temp src      SpO2      Weight      Height      Head Circumference      Peak Flow      Pain Score      Pain Loc  Pain Education      Exclude from Growth Chart    No data found.  Updated Vital Signs BP (!) 142/95   Pulse 83   Temp 97.9 F (36.6 C)   Resp 16   SpO2 96%    Physical Exam Vitals and nursing note reviewed.  Constitutional:      Appearance: Normal appearance. He is normal weight. He is not ill-appearing.  HENT:     Head: Normocephalic and atraumatic.     Right Ear: Ear canal and external ear normal.     Left Ear: Tympanic membrane, ear canal and external ear normal.     Ears:     Comments: Right TM: Red rimmed, erythematous    Mouth/Throat:     Mouth: Mucous membranes are moist.     Pharynx: Oropharynx is clear. Uvula midline. Posterior oropharyngeal erythema present.     Tonsils: Tonsillar exudate present.  Eyes:     Conjunctiva/sclera: Conjunctivae normal.     Pupils: Pupils are equal, round, and reactive to light.  Cardiovascular:     Rate and Rhythm: Normal rate and regular rhythm.     Pulses: Normal pulses.     Heart sounds: Normal heart sounds.  Pulmonary:      Effort: Pulmonary effort is normal.     Breath sounds: Normal breath sounds. No wheezing, rhonchi or rales.  Musculoskeletal:        General: Normal range of motion.     Cervical back: Normal range of motion and neck supple.  Skin:    General: Skin is warm and dry.  Neurological:     General: No focal deficit present.     Mental Status: He is alert and oriented to person, place, and time. Mental status is at baseline.  Psychiatric:        Mood and Affect: Mood normal.        Behavior: Behavior normal.        Thought Content: Thought content normal.      UC Treatments / Results  Labs (all labs ordered are listed, but only abnormal results are displayed) Labs Reviewed - No data to display  EKG   Radiology No results found.  Procedures Procedures (including critical care time)  Medications Ordered in UC Medications - No data to display  Initial Impression / Assessment and Plan / UC Course  I have reviewed the triage vital signs and the nursing notes.  Pertinent labs & imaging results that were available during my care of the patient were reviewed by me and considered in my medical decision making (see chart for details).     MDM: 1.  Acute right otitis media-Rx'd Augmentin 875/125 mg tablet: Take 1 tablet twice daily x 10 days; 2.  Pharyngitis, unspecified etiology-same as 1.  Rx Augmentin 875/225 mg tablet: Take 1 tablet twice daily x 10 days. Advised patient to take medication as directed with food to completion.  Encouraged to increase daily water intake to 64 ounces per day while taking this medication.  Advised if symptoms worsen and/or unresolved please follow-up with your PCP or here for further evaluation.  Patient discharged home, hemodynamically stable. Final Clinical Impressions(s) / UC Diagnoses   Final diagnoses:  Pharyngitis, unspecified etiology  Acute right otitis media     Discharge Instructions      Advised patient to take medication as directed  with food to completion.  Encouraged to increase daily water intake to 64 ounces per day while taking this medication.  Advised if symptoms worsen and/or unresolved please follow-up with your PCP or here for further evaluation.     ED Prescriptions     Medication Sig Dispense Auth. Provider   amoxicillin-clavulanate (AUGMENTIN) 875-125 MG tablet Take 1 tablet by mouth 2 (two) times daily for 10 days. 20 tablet Trevor Iha, FNP      PDMP not reviewed this encounter.   Trevor Iha, FNP 01/13/24 Serena Croissant

## 2024-01-13 NOTE — Discharge Instructions (Addendum)
 Advised patient to take medication as directed with food to completion.  Encouraged to increase daily water intake to 64 ounces per day while taking this medication.  Advised if symptoms worsen and/or unresolved please follow-up with your PCP or here for further evaluation.

## 2024-06-26 ENCOUNTER — Encounter: Payer: Self-pay | Admitting: Sports Medicine

## 2024-11-05 ENCOUNTER — Ambulatory Visit (INDEPENDENT_AMBULATORY_CARE_PROVIDER_SITE_OTHER)

## 2024-11-05 VITALS — BP 147/95 | HR 82 | Ht 73.0 in | Wt 262.0 lb

## 2024-11-05 DIAGNOSIS — M5030 Other cervical disc degeneration, unspecified cervical region: Secondary | ICD-10-CM

## 2024-11-05 DIAGNOSIS — M5136 Other intervertebral disc degeneration, lumbar region with discogenic back pain only: Secondary | ICD-10-CM | POA: Diagnosis not present

## 2024-11-05 DIAGNOSIS — G8929 Other chronic pain: Secondary | ICD-10-CM | POA: Diagnosis not present

## 2024-11-05 DIAGNOSIS — M25572 Pain in left ankle and joints of left foot: Secondary | ICD-10-CM | POA: Diagnosis not present

## 2024-11-05 DIAGNOSIS — M25531 Pain in right wrist: Secondary | ICD-10-CM

## 2024-11-05 NOTE — Progress Notes (Signed)
 "  Subjective:    Patient ID: William Foley, male    DOB: 44 y.o., 02/27/1981   MRN: [id number]  Chief Complaint: Neck and low back pain  Discussed the use of AI scribe software for clinical note transcription with the patient, who gave verbal consent to proceed.  History of Present Illness William Foley is a 44 year old male with cervical and lumbar degenerative disc disease who presents with chronic neck and low back pain, as well as right wrist and left ankle pain and swelling.  Cervical Radicular Neck Pain: - Severe neck pain present for several years - Pain radiates from the neck through the shoulder and scapular region, occasionally into the upper arm but not the hand - Pain is excruciating without anti-inflammatories and recurs when he is discontinued - Ibuprofen  and dual action Advil  twice daily provide good relief - Prior cervical injection provided no benefit - No history of physical therapy - Cervical spine MRI on 09/05/2022 showed degenerative changes - Concerned about long-term NSAID use and potential side effects  Lumbar Discogenic Back Pain: - Longstanding low back pain - Lumbar MRI on 08/25/2020 showed degenerative changes - Lumbar injections 4-5 years ago were highly effective - Minimal symptoms since injections, with only occasional mild radicular symptoms - No significant recurrent low back pain  Chronic Wrist Pain and Stiffness: - Chronic right greater than left wrist pain and stiffness - No history of acute injury - Pain is intermittent and sharp with certain movements - Prior home exercises performed - No imaging or procedures for the wrists  Left Ankle Pain and Swelling: - Intermittent left ankle pain and swelling for the past month - Marked morning stiffness and difficulty walking on first steps, improving throughout the day - Swelling worsens by the end of the day and improves overnight - Works on his feet all day - No history of acute ankle  injury    Review of Pertinent Imaging: 09/05/2022 cervical spine MRI: IMPRESSION: Mildly motion degraded exam. Cervical spondylosis, as outlined. At C2-C3, a shallow broad-based central disc protrusion mildly effaces the ventral thecal sac (without significant spinal cord mass effect). No significant spinal canal stenosis at the remaining levels. Multilevel foraminal stenosis, as detailed and greatest on the left at C4-C5 (moderate) and bilaterally at C5-C6 (moderate/severe right, moderate left). Disc degeneration is greatest at C5-C6 (mild-to-moderate at this level).    08/25/2020 lumbar spine MRI: IMPRESSION: 1. Disc degeneration with annular disc bulging and a focal disc extrusion in the left subarticular zone at L5-S1. There is resulting posterior displacement of the left S1 nerve root as well as possible left L5 nerve root encroachment. 2. No other significant disc space findings.   Objective:   Vitals:   11/05/24 1417  BP: (!) 147/95  Pulse: 82   Cervical Spine -Inspection: no swelling or skin changes -Palpation: TTP - paraspinals, - midline, + TTP at T2 -AROM/PROM: FROM in all planes of the neck -Strength: full neck flexion/extension (C1/C2), neck sidebending (C3), shoulder shrug (C4), shoulder abduction (C5), elbow flexion (C6), elbow extension (C7), thumb extension (C8/radial), finger abduction (T1/ulnar), OK sign (median) -Sensation: intact sensation of the thumb (C6), 2nd/3rd digits (C7), 4th/5th digits (C8). -Reflexes: normal biceps (C5/6), brachioradialis (C5/6), triceps (C7/8) reflexes, equal bilaterally -Special tests: - tornado test, negative Spurling  Right wrist: Nontender to palpation over the dorsal and volar wrist Nontender over the radial styloid.  Negative CMC grind test.  Negative Finkelstein.  Negative Eickhoff.     Assessment &  Plan:   Assessment & Plan Cervical degenerative disc disease with neck pain Chronic cervical degenerative disc  disease causes persistent neck pain radiating to the shoulder and upper arm, without nerve root impingement. Pain is managed with NSAIDs, but long-term use risks renal, gastrointestinal, and cardiovascular issues. Physical therapy is recommended as it has not been attempted. Acetaminophen is preferred for analgesia due to its safer profile. He is referred to physical therapy for cervical spine rehabilitation, with at least one in-person session to review exercises and technique. A trial of acetaminophen 1000 mg three times daily is recommended as the primary analgesic. Limited adjunctive use of ibuprofen  200 mg as needed is advised to minimize NSAID exposure. Risks of long-term NSAID use were discussed with him and his wife. A follow-up is planned to assess response to physical therapy and the analgesic regimen.  Lumbar degenerative disc disease with history of discogenic back pain Lumbar degenerative disc disease with prior episodes of discogenic low back pain was previously managed with epidural steroid injections. Currently, low back pain is well controlled with only occasional mild symptoms.  Chronic right wrist pain, possible tendinitis Chronic right wrist pain, possibly due to extensor tendinitis, is intermittent and exacerbated by certain movements, with no acute findings on examination. Further evaluation is warranted to assess for tendinopathy or other structural abnormalities. A diagnostic ultrasound of the right wrist is planned at the next visit to assess for tendinopathy or other structural abnormalities. Treatment options and next steps will be discussed following ultrasound findings.  Left ankle pain and swelling Intermittent left ankle pain and swelling with morning stiffness and swelling recurring by the end of the day. The etiology is unclear, and further evaluation is indicated. A diagnostic ultrasound of the left ankle is planned at the next visit to evaluate for possible structural or  inflammatory causes.   "

## 2024-11-05 NOTE — Addendum Note (Signed)
 Addended by: Thomasena Vandenheuvel A on: 11/05/2024 04:04 PM   Modules accepted: Orders

## 2024-11-07 ENCOUNTER — Ambulatory Visit (HOSPITAL_BASED_OUTPATIENT_CLINIC_OR_DEPARTMENT_OTHER)
Admission: RE | Admit: 2024-11-07 | Discharge: 2024-11-07 | Disposition: A | Discharge status: Home / Self Care | Source: Ambulatory Visit

## 2024-11-07 ENCOUNTER — Ambulatory Visit

## 2024-11-07 ENCOUNTER — Other Ambulatory Visit: Payer: Self-pay

## 2024-11-07 VITALS — BP 130/90 | Ht 73.0 in | Wt 262.0 lb

## 2024-11-07 DIAGNOSIS — M25531 Pain in right wrist: Secondary | ICD-10-CM | POA: Insufficient documentation

## 2024-11-07 DIAGNOSIS — G8929 Other chronic pain: Secondary | ICD-10-CM

## 2024-11-07 DIAGNOSIS — M25572 Pain in left ankle and joints of left foot: Secondary | ICD-10-CM

## 2024-11-07 NOTE — Progress Notes (Signed)
" ° °  Subjective:    Patient ID: William Foley, male    DOB: 44 y.o., May 20, 1981   MRN: [id number]  Chief Complaint: Right wrist, left ankle ultrasound visit  History of Present Illness Patient presenting today for follow-up ultrasound on chronic right wrist pain and chronic left ankle pain for which he was evaluated by myself on 11/05/2024.     Objective:   Vitals:   11/07/24 1603  BP: (!) 130/90   Right wrist exam: Nontender to palpation over the dorsal and volar wrist Mild pain reproduced with resisted extension of the wrist in the region of the mid-dorsal wrist. Tenderness to palpation over the radiocarpal joint the radialmost side Nontender over the radial styloid, snuffbox.  No pain with clenched fist. Negative CMC grind test.  Negative Finkelstein.  Negative Eichoff.  Limited US  of the left ankle There is significant hypoechogenic fluid signal present within the tendon sheaths of both the peroneus brevis and peroneus longus around the level of the lateral malleolus but also slightly proximal to this as well. There is flattening of the peroneus longus in this region Interpretation: Longitudinal split tearing of peroneus longus Significant tenosynovitis peroneus longus and peroneus brevis.  Limited ultrasound examination right wrist -Extensor tendon compartments Abductor pollicis longus and extensor pollicis brevis appear normal. Extensor carpi radialis longus and brevis appear normal. Extensor pollicis longus appears normal. Extensor digitorum and extensor indicis appear normal. Extensor digiti minimi appears normal. Extensor carpi ulnaris appears normal. -the dorsal scapholunate interval appears slightly enlarged.  Evaluation of the scapholunate ligament was technically difficult -the radiocarpal, 1st through 5th MCP, 1st through 5th PIP and 1st through 5th DIP joints appear normal. -The extensor carpi ulnaris tendon appears normal. Dynamic scanning does not show any  signs of ECU subluxation. -The triangular fibrocartilage complex appears normal. -The underlying ulnocarpal joint appears normal. The distal radiocarpal joint appears to have several areas of degenerative change with notable synovial hypertrophy. 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  I recommend he come back for corticosteroid injection of these tendons at his convenience and initiate physical home exercises for these.  Regarding his right wrist, apart from mild degenerative changes noted to the radiocarpal joint most prominent on the radial side and some mild increasing of his scapholunate interval, I do not find a clear cause of his wrist pain at this time.  I will obtain x-rays and evaluate those for further guidance regarding on treatment based on his wrist anatomy.  Addendum 4 view plain film radiographs obtained of the right wrist, independent evaluation revealing increased space in the scapholunate interval ranging between 2.4 mm and 3.15 mm on standard PA. I will discuss these results with him at his visit with myself on 11/08/24 and consider MRI for further evaluation of the scapholunate ligament.  "

## 2024-11-08 ENCOUNTER — Other Ambulatory Visit: Payer: Self-pay

## 2024-11-08 ENCOUNTER — Ambulatory Visit

## 2024-11-08 VITALS — BP 140/98 | Ht 73.0 in | Wt 262.0 lb

## 2024-11-08 DIAGNOSIS — M65872 Other synovitis and tenosynovitis, left ankle and foot: Secondary | ICD-10-CM

## 2024-11-08 DIAGNOSIS — M25331 Other instability, right wrist: Secondary | ICD-10-CM | POA: Diagnosis not present

## 2024-11-08 DIAGNOSIS — M65979 Unspecified synovitis and tenosynovitis, unspecified ankle and foot: Secondary | ICD-10-CM

## 2024-11-08 MED ORDER — METHYLPREDNISOLONE ACETATE 40 MG/ML IJ SUSP
40.0000 mg | Freq: Once | INTRAMUSCULAR | Status: AC
  Administered 2024-11-08: 30 mg via INTRA_ARTICULAR

## 2024-11-08 NOTE — Progress Notes (Addendum)
" ° °  Subjective:    Patient ID: William Foley, male    DOB: 44 y.o., [birth year]   MRN: [id number]  Chief Complaint: Left ankle peroneal tenosynovitis - injection visit  History of Present Illness Erminio presents today for a left peroneal tendon injection.  He also asks about the read on his right wrist x-rays which did note an enlarged scapholunate interval. He does not recall any specific instance of falling onto an outstretched hand with significant pain following it over the course of his life.  Wrist pain still happening mostly on occasion and he does not persist for long periods before spontaneously remitting.    Objective:   Vitals:   11/08/24 1531  BP: (!) 140/98   Left ankle peroneal tendon Injection with Ultrasound Guidance Procedure Note Izik Bingman 1981/08/29 Indications: Pain Procedure Details Following the description of risks including infection, bleeding, and damage to surrounding structures patient provided written consent for left ankle peroneal tendon injection with ultrasound guidance. Ultrasound was used to identify the peroneal tendons. The patient was then prepped in the usual fashion with chlorhexidine. Following topical anesthetization with ethyl chloride, the patient was injected into the peroneus brevis and peroneus longus tendons with a solution of 40mg  depo medrol , 3cc bupivicaine, 1cc sodium bicarbonate 4.2%. This was well visualized under ultrasound, please see associated photographic documentation. Patient tolerated well without complication. Precautions provided. Cleaned and dressing applied.     Assessment & Plan:   Assessment & Plan Abeer tolerated the procedure well.  He will obtain a lace up ankle brace to provide additional stability and offload his everters and plan for ~2 weeks of use.  Continue home exercise program I provided him with yesterday.  Follow-up in 6 weeks.  Regarding his right wrist, Rayshun had 5 view radiographs obtained of his  bilateral wrists in February of last year which show a normal scapholunate interval in the left wrist and an enlarged 1 at approximately 2.6 mm in the right.  Of note, though, his left wrist x-rays are labeled as R in the actual image.  His right wrist plain films are labeled correctly.  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   "

## 2024-11-09 ENCOUNTER — Other Ambulatory Visit: Payer: Self-pay

## 2024-11-09 DIAGNOSIS — M25331 Other instability, right wrist: Secondary | ICD-10-CM

## 2024-11-27 ENCOUNTER — Ambulatory Visit (HOSPITAL_BASED_OUTPATIENT_CLINIC_OR_DEPARTMENT_OTHER)

## 2024-12-20 ENCOUNTER — Ambulatory Visit
